# Patient Record
Sex: Female | Born: 1975 | Race: Black or African American | Hispanic: No | Marital: Single | Smoking: Never smoker
Health system: Southern US, Community
[De-identification: ages and names within clinical notes are randomized; demographics above are authoritative.]

## PROBLEM LIST (undated history)

## (undated) DIAGNOSIS — A630 Anogenital (venereal) warts: Secondary | ICD-10-CM

## (undated) DIAGNOSIS — E669 Obesity, unspecified: Secondary | ICD-10-CM

## (undated) DIAGNOSIS — D259 Leiomyoma of uterus, unspecified: Secondary | ICD-10-CM

## (undated) DIAGNOSIS — Z9889 Other specified postprocedural states: Secondary | ICD-10-CM

## (undated) DIAGNOSIS — Z9289 Personal history of other medical treatment: Secondary | ICD-10-CM

## (undated) DIAGNOSIS — D649 Anemia, unspecified: Secondary | ICD-10-CM

## (undated) DIAGNOSIS — A749 Chlamydial infection, unspecified: Secondary | ICD-10-CM

## (undated) DIAGNOSIS — N800 Endometriosis of uterus: Secondary | ICD-10-CM

## (undated) DIAGNOSIS — D219 Benign neoplasm of connective and other soft tissue, unspecified: Secondary | ICD-10-CM

## (undated) HISTORY — PX: DILATION AND CURETTAGE, DIAGNOSTIC / THERAPEUTIC: SUR384

## (undated) HISTORY — DX: Anogenital (venereal) warts: A63.0

## (undated) HISTORY — DX: Benign neoplasm of connective and other soft tissue, unspecified: D21.9

## (undated) HISTORY — DX: Anemia, unspecified: D64.9

## (undated) HISTORY — DX: Morbid (severe) obesity due to excess calories: E66.01

## (undated) HISTORY — PX: BREAST LUMPECTOMY: SHX2

## (undated) HISTORY — DX: Obesity, unspecified: E66.9

## (undated) HISTORY — DX: Leiomyoma of uterus, unspecified: D25.9

## (undated) HISTORY — DX: Endometriosis of uterus: N80.0

---

## 2004-03-22 HISTORY — PX: BREAST EXCISIONAL BIOPSY: SUR124

## 2004-07-10 ENCOUNTER — Emergency Department (HOSPITAL_COMMUNITY): Admission: EM | Admit: 2004-07-10 | Discharge: 2004-07-10 | Payer: Self-pay | Admitting: Emergency Medicine

## 2005-08-29 ENCOUNTER — Other Ambulatory Visit: Admission: RE | Admit: 2005-08-29 | Discharge: 2005-08-29 | Payer: Self-pay | Admitting: Obstetrics and Gynecology

## 2005-09-12 ENCOUNTER — Inpatient Hospital Stay (HOSPITAL_COMMUNITY): Admission: AD | Admit: 2005-09-12 | Discharge: 2005-09-12 | Payer: Self-pay | Admitting: Obstetrics and Gynecology

## 2005-10-23 ENCOUNTER — Inpatient Hospital Stay (HOSPITAL_COMMUNITY): Admission: AD | Admit: 2005-10-23 | Discharge: 2005-10-23 | Payer: Self-pay | Admitting: Obstetrics and Gynecology

## 2006-01-03 ENCOUNTER — Ambulatory Visit (HOSPITAL_COMMUNITY): Admission: RE | Admit: 2006-01-03 | Discharge: 2006-01-03 | Payer: Self-pay | Admitting: Obstetrics and Gynecology

## 2006-01-17 ENCOUNTER — Ambulatory Visit (HOSPITAL_COMMUNITY): Admission: RE | Admit: 2006-01-17 | Discharge: 2006-01-17 | Payer: Self-pay | Admitting: Obstetrics and Gynecology

## 2006-02-22 ENCOUNTER — Inpatient Hospital Stay (HOSPITAL_COMMUNITY): Admission: AD | Admit: 2006-02-22 | Discharge: 2006-02-24 | Payer: Self-pay | Admitting: Obstetrics and Gynecology

## 2006-02-25 ENCOUNTER — Ambulatory Visit: Payer: Self-pay | Admitting: Oncology

## 2006-04-11 ENCOUNTER — Encounter (INDEPENDENT_AMBULATORY_CARE_PROVIDER_SITE_OTHER): Payer: Self-pay | Admitting: Specialist

## 2006-04-11 ENCOUNTER — Inpatient Hospital Stay (HOSPITAL_COMMUNITY): Admission: AD | Admit: 2006-04-11 | Discharge: 2006-04-13 | Payer: Self-pay | Admitting: Obstetrics and Gynecology

## 2006-06-24 ENCOUNTER — Ambulatory Visit: Payer: Self-pay | Admitting: Oncology

## 2006-10-01 ENCOUNTER — Emergency Department (HOSPITAL_COMMUNITY): Admission: EM | Admit: 2006-10-01 | Discharge: 2006-10-01 | Payer: Self-pay | Admitting: Emergency Medicine

## 2007-05-09 ENCOUNTER — Encounter: Admission: RE | Admit: 2007-05-09 | Discharge: 2007-05-09 | Payer: Self-pay | Admitting: Obstetrics and Gynecology

## 2007-08-15 ENCOUNTER — Emergency Department (HOSPITAL_COMMUNITY): Admission: EM | Admit: 2007-08-15 | Discharge: 2007-08-16 | Payer: Self-pay | Admitting: Emergency Medicine

## 2008-04-08 ENCOUNTER — Ambulatory Visit: Payer: Self-pay | Admitting: Internal Medicine

## 2008-04-08 ENCOUNTER — Encounter (INDEPENDENT_AMBULATORY_CARE_PROVIDER_SITE_OTHER): Payer: Self-pay | Admitting: Internal Medicine

## 2008-04-08 DIAGNOSIS — R5383 Other fatigue: Secondary | ICD-10-CM

## 2008-04-08 DIAGNOSIS — J310 Chronic rhinitis: Secondary | ICD-10-CM | POA: Insufficient documentation

## 2008-04-08 DIAGNOSIS — R5381 Other malaise: Secondary | ICD-10-CM

## 2008-04-08 DIAGNOSIS — M79609 Pain in unspecified limb: Secondary | ICD-10-CM

## 2008-04-08 DIAGNOSIS — N644 Mastodynia: Secondary | ICD-10-CM | POA: Insufficient documentation

## 2008-04-09 LAB — CONVERTED CEMR LAB
BUN: 13 mg/dL (ref 6–23)
Creatinine, Ser: 0.76 mg/dL (ref 0.40–1.20)
Eosinophils Relative: 3 % (ref 0–5)
Hemoglobin: 11.3 g/dL — ABNORMAL LOW (ref 12.0–15.0)
Lymphocytes Relative: 38 % (ref 12–46)
MCHC: 31 g/dL (ref 30.0–36.0)
MCV: 81.8 fL (ref 78.0–100.0)
Monocytes Absolute: 0.4 10*3/uL (ref 0.1–1.0)
Monocytes Relative: 7 % (ref 3–12)
Potassium: 4.1 meq/L (ref 3.5–5.3)
Sodium: 139 meq/L (ref 135–145)
TSH: 0.563 microintl units/mL (ref 0.350–4.50)
WBC: 5.3 10*3/uL (ref 4.0–10.5)

## 2008-05-11 ENCOUNTER — Ambulatory Visit: Payer: Self-pay | Admitting: Internal Medicine

## 2008-05-11 DIAGNOSIS — D509 Iron deficiency anemia, unspecified: Secondary | ICD-10-CM

## 2008-06-04 ENCOUNTER — Encounter (INDEPENDENT_AMBULATORY_CARE_PROVIDER_SITE_OTHER): Payer: Self-pay | Admitting: Internal Medicine

## 2008-06-04 ENCOUNTER — Ambulatory Visit (HOSPITAL_COMMUNITY): Admission: RE | Admit: 2008-06-04 | Discharge: 2008-06-04 | Payer: Self-pay | Admitting: Internal Medicine

## 2008-06-04 ENCOUNTER — Ambulatory Visit: Payer: Self-pay | Admitting: Internal Medicine

## 2008-06-04 DIAGNOSIS — R002 Palpitations: Secondary | ICD-10-CM | POA: Insufficient documentation

## 2008-06-04 DIAGNOSIS — D259 Leiomyoma of uterus, unspecified: Secondary | ICD-10-CM

## 2008-06-04 HISTORY — DX: Leiomyoma of uterus, unspecified: D25.9

## 2008-06-08 ENCOUNTER — Ambulatory Visit (HOSPITAL_COMMUNITY): Admission: RE | Admit: 2008-06-08 | Discharge: 2008-06-08 | Payer: Self-pay | Admitting: Internal Medicine

## 2008-06-17 ENCOUNTER — Ambulatory Visit (HOSPITAL_COMMUNITY): Admission: RE | Admit: 2008-06-17 | Discharge: 2008-06-17 | Payer: Self-pay | Admitting: Internal Medicine

## 2008-06-17 ENCOUNTER — Encounter: Payer: Self-pay | Admitting: Internal Medicine

## 2008-08-25 ENCOUNTER — Encounter (INDEPENDENT_AMBULATORY_CARE_PROVIDER_SITE_OTHER): Payer: Self-pay | Admitting: Internal Medicine

## 2008-08-25 ENCOUNTER — Ambulatory Visit: Payer: Self-pay | Admitting: Internal Medicine

## 2008-08-25 DIAGNOSIS — N898 Other specified noninflammatory disorders of vagina: Secondary | ICD-10-CM | POA: Insufficient documentation

## 2008-08-25 DIAGNOSIS — K069 Disorder of gingiva and edentulous alveolar ridge, unspecified: Secondary | ICD-10-CM

## 2008-08-25 DIAGNOSIS — K056 Periodontal disease, unspecified: Secondary | ICD-10-CM | POA: Insufficient documentation

## 2008-08-25 LAB — CONVERTED CEMR LAB
Chlamydia, DNA Probe: NEGATIVE
GC Probe Amp, Genital: NEGATIVE

## 2008-08-26 ENCOUNTER — Encounter (INDEPENDENT_AMBULATORY_CARE_PROVIDER_SITE_OTHER): Payer: Self-pay | Admitting: Internal Medicine

## 2008-10-12 ENCOUNTER — Ambulatory Visit: Payer: Self-pay | Admitting: Internal Medicine

## 2008-10-12 DIAGNOSIS — R030 Elevated blood-pressure reading, without diagnosis of hypertension: Secondary | ICD-10-CM | POA: Insufficient documentation

## 2008-10-12 DIAGNOSIS — F5089 Other specified eating disorder: Secondary | ICD-10-CM | POA: Insufficient documentation

## 2008-10-12 DIAGNOSIS — R51 Headache: Secondary | ICD-10-CM

## 2008-10-12 DIAGNOSIS — R519 Headache, unspecified: Secondary | ICD-10-CM | POA: Insufficient documentation

## 2008-10-13 LAB — CONVERTED CEMR LAB
Basophils Absolute: 0 10*3/uL (ref 0.0–0.1)
Basophils Relative: 0 % (ref 0–1)
Eosinophils Absolute: 0.2 10*3/uL (ref 0.0–0.7)
Eosinophils Relative: 2 % (ref 0–5)
HCT: 33.9 % — ABNORMAL LOW (ref 36.0–46.0)
Hemoglobin: 10.4 g/dL — ABNORMAL LOW (ref 12.0–15.0)
Lymphocytes Relative: 26 % (ref 12–46)
Monocytes Absolute: 0.7 10*3/uL (ref 0.1–1.0)
Monocytes Relative: 8 % (ref 3–12)
Neutrophils Relative %: 63 % (ref 43–77)
Potassium: 3.7 meq/L (ref 3.5–5.3)
RBC: 4.29 M/uL (ref 3.87–5.11)
Sodium: 138 meq/L (ref 135–145)
WBC: 8.5 10*3/uL (ref 4.0–10.5)

## 2008-11-29 ENCOUNTER — Encounter (INDEPENDENT_AMBULATORY_CARE_PROVIDER_SITE_OTHER): Payer: Self-pay | Admitting: Internal Medicine

## 2009-01-17 ENCOUNTER — Ambulatory Visit: Payer: Self-pay | Admitting: Internal Medicine

## 2009-01-18 LAB — CONVERTED CEMR LAB
Eosinophils Absolute: 0.2 10*3/uL (ref 0.0–0.7)
Eosinophils Relative: 4 % (ref 0–5)
HCT: 30.9 % — ABNORMAL LOW (ref 36.0–46.0)
Hemoglobin: 9.8 g/dL — ABNORMAL LOW (ref 12.0–15.0)
MCV: 77.8 fL — ABNORMAL LOW (ref >=78.0)
Neutrophils Relative %: 50 % (ref 43–77)
Platelets: 265 10*3/uL (ref 150–400)
RBC: 3.97 M/uL (ref 3.87–5.11)
RDW: 15.2 % (ref 11.5–15.5)

## 2009-02-16 ENCOUNTER — Ambulatory Visit: Payer: Self-pay | Admitting: Internal Medicine

## 2009-04-22 ENCOUNTER — Encounter (INDEPENDENT_AMBULATORY_CARE_PROVIDER_SITE_OTHER): Payer: Self-pay | Admitting: Internal Medicine

## 2009-04-22 ENCOUNTER — Ambulatory Visit: Payer: Self-pay | Admitting: Internal Medicine

## 2009-04-23 DIAGNOSIS — R05 Cough: Secondary | ICD-10-CM

## 2009-04-27 ENCOUNTER — Ambulatory Visit: Payer: Self-pay | Admitting: Infectious Diseases

## 2009-04-28 LAB — CONVERTED CEMR LAB
Candida species: NEGATIVE
Chlamydia, DNA Probe: NEGATIVE
GC Probe Amp, Genital: NEGATIVE

## 2009-06-17 ENCOUNTER — Telehealth (INDEPENDENT_AMBULATORY_CARE_PROVIDER_SITE_OTHER): Payer: Self-pay | Admitting: *Deleted

## 2009-06-17 ENCOUNTER — Telehealth: Payer: Self-pay | Admitting: *Deleted

## 2009-06-24 ENCOUNTER — Ambulatory Visit: Payer: Self-pay | Admitting: Internal Medicine

## 2009-06-24 DIAGNOSIS — B079 Viral wart, unspecified: Secondary | ICD-10-CM | POA: Insufficient documentation

## 2009-06-24 LAB — CONVERTED CEMR LAB: Gardnerella vaginalis: POSITIVE — AB

## 2009-06-27 LAB — CONVERTED CEMR LAB: Chlamydia, DNA Probe: NEGATIVE

## 2009-12-20 ENCOUNTER — Emergency Department (HOSPITAL_COMMUNITY): Admission: EM | Admit: 2009-12-20 | Discharge: 2009-12-20 | Payer: Self-pay | Admitting: Emergency Medicine

## 2009-12-25 ENCOUNTER — Emergency Department (HOSPITAL_COMMUNITY)
Admission: EM | Admit: 2009-12-25 | Discharge: 2009-12-25 | Payer: Self-pay | Source: Home / Self Care | Admitting: Emergency Medicine

## 2009-12-26 ENCOUNTER — Telehealth (INDEPENDENT_AMBULATORY_CARE_PROVIDER_SITE_OTHER): Payer: Self-pay | Admitting: *Deleted

## 2009-12-29 ENCOUNTER — Telehealth: Payer: Self-pay | Admitting: Internal Medicine

## 2010-03-12 ENCOUNTER — Encounter: Payer: Self-pay | Admitting: Obstetrics and Gynecology

## 2010-03-12 ENCOUNTER — Encounter: Payer: Self-pay | Admitting: Internal Medicine

## 2010-03-14 ENCOUNTER — Ambulatory Visit: Admit: 2010-03-14 | Payer: Self-pay

## 2010-03-19 LAB — CONVERTED CEMR LAB
Anti Nuclear Antibody(ANA): POSITIVE — AB
HCT: 32.8 % — ABNORMAL LOW (ref 36.0–46.0)
Hemoglobin: 10.6 g/dL — ABNORMAL LOW (ref 12.0–15.0)
MCHC: 32.3 g/dL (ref 30.0–36.0)
MCV: 81.2 fL (ref 78.0–100.0)
RBC: 4.04 M/uL (ref 3.87–5.11)
WBC: 7 10*3/uL (ref 4.0–10.5)

## 2010-03-21 NOTE — Assessment & Plan Note (Signed)
Summary: acute-thinks she has a yeast infection/cfb   Vital Signs:  Patient profile:   35 year old female Height:      66 inches (167.64 cm) Weight:      239.5 pounds (108.86 kg) BMI:     38.80 Temp:     97.0 degrees F (36.11 degrees C) oral Pulse rate:   76 / minute BP sitting:   116 / 79  (right arm)  Vitals Entered By: Stanton Kidney Ditzler RN (Jun 24, 2009 11:57 AM) Is Patient Diabetic? No Pain Assessment Patient in pain? no      Nutritional Status BMI of > 30 = obese Nutritional Status Detail appetite good  Have you ever been in a relationship where you felt threatened, hurt or afraid?denies   Does patient need assistance? Functional Status Self care Ambulation Normal Comments Vag odor, skin broken out around lips and wants cream for vag warts.   Primary Care Provider:  Zara Council MD   History of Present Illness: 35 year old with Past Medical History: Uterine fibroids Anemia Obesity  She is having vaginal discharge, since 4 days ago and itching. She denies fever, nausea, abdominal pain.   Depression History:      The patient denies a depressed mood most of the day and a diminished interest in her usual daily activities.         Preventive Screening-Counseling & Management  Alcohol-Tobacco     Smoking Status: never  Caffeine-Diet-Exercise     Does Patient Exercise: no  Current Medications (verified): 1)  None  Allergies: 1)  ! * Shellfish  Review of Systems  The patient denies fever, chest pain, syncope, dyspnea on exertion, peripheral edema, prolonged cough, headaches, hemoptysis, abdominal pain, melena, and hematochezia.    Physical Exam  General:  alert, well-developed, and well-nourished.   Head:  normocephalic, atraumatic, and no abnormalities observed.   Lungs:  normal respiratory effort, no intercostal retractions, and no accessory muscle use.   Abdomen:  soft, non-tender, normal bowel sounds, and no distention.   Genitalia:  normal introitus,  no vaginal or cervical lesions, and vaginal discharge.     Impression & Recommendations:  Problem # 1:  VAGINAL DISCHARGE (ICD-623.5) She is complaining of vaginal discharge. Pelvic exam showed whitish discharge. I will prescribe flagyl empirically for bacterial vaginosis. I will follow culture. She think that she has labial wart. I was not impress by physical exam. I will refer to GYN for further evaluation of wart.  Orders: T-Chlamydia & GC Probe, Genital ([id number]) T-Wet Prep by Molecular Probe [phone])  Problem # 2:  SCREENING FOR MALIGNANT NEOPLASM OF THE CERVIX (ICD-V76.2) I did Pap Smear today.  Orders: T-PAP [facility]) [phone])  Complete Medication List: 1)  Metronidazole 500 Mg Tabs (Metronidazole) .... Take 1 tablet by mouth twice a day.  Other Orders: Gynecologic Referral (Gyn)  Patient Instructions: 1)  Please schedule a follow-up appointment in 3 months. Prescriptions: METRONIDAZOLE 500 MG TABS (METRONIDAZOLE) Take 1 tablet by mouth twice a day.  #14 x 0   Entered and Authorized by:   Hartley Barefoot MD   Signed by:   Hartley Barefoot MD on 06/24/2009   Method used:   Print then Give to Patient   RxID:   [id number]   Prevention & Chronic Care Immunizations   Influenza vaccine: Not documented    Tetanus booster: Not documented    Pneumococcal vaccine: Not documented  Other Screening   Pap smear: NEGATIVE FOR INTRAEPITHELIAL LESIONS OR MALIGNANCY.  (  08/25/2008)   Smoking status: never  (06/24/2009)      Resource handout printed.  Process Orders Check Orders Results:     [facility]: ABN not required for this insurance Tests Sent for requisitioning (Jun 24, 2009 3:05 PM):     06/24/2009: [facility] -- T-Chlamydia & GC Probe, Genital [[id number]] (signed)     06/24/2009: [facility] -- T-Wet Prep by Molecular Probe [phone] (signed)

## 2010-03-21 NOTE — Progress Notes (Signed)
Summary: PREVENTIVE COLONOSCOPY  Phone Note Outgoing Call   Call placed by: Shon Hough,  December 26, 2009 2:44 PM Summary of Call: Patient under the age of 71.  No information listed in EMR or ECHART in regards to a colonscopy. Initial call taken by: Shon Hough,  December 26, 2009 2:44 PM

## 2010-03-21 NOTE — Assessment & Plan Note (Signed)
Summary: CHECKUP/SB.   Vital Signs:  Patient profile:   35 year old female Height:      66 inches (167.64 cm) Weight:      242.6 pounds (108.09 kg) BMI:     39.30 Temp:     97.6 degrees F (36.44 degrees C) oral Pulse rate:   84 / minute BP sitting:   138 / 78  (left arm) Cuff size:   large  Vitals Entered By: Theotis Barrio NT II (April 22, 2009 3:44 PM) CC: RESULTS OF LABS / HAVING SHARP PAIN IN LEFT FOOT / ITCH LEFT BREAST Is Patient Diabetic? No Pain Assessment Patient in pain? no      Nutritional Status 38  Have you ever been in a relationship where you felt threatened, hurt or afraid?No   Does patient need assistance? Functional Status Self care Ambulation Normal Comments LEFT BREAST ITCH / RESULTS OF LABS / COUGH / RESULTS OF LABS   Primary Care Provider:  Zara Council MD  CC:  RESULTS OF LABS / HAVING SHARP PAIN IN LEFT FOOT / ITCH LEFT BREAST.  History of Present Illness: Katherine Hutchinson is a 35 year old Female with PMH/problems as outlined in the EMR, who presents to the [facility] with chief complaint(s) of:    1. waking up every morning with cough and brings up phlegm. this has been going up for about a week now.   2. Left breast: itching: same problem as noted in the past. No obvious swelling or discharge. Off and on itching, has family h/o breast cancer in father side. No axillary LN or any swelling noted. No blood or any abnormal discharge. She couldn't have the breast US as previously arranged. Her orange card expired recently.   3. Continues to have shooting pains down her legs, usually goes to the left side.   Depression History:      The patient denies a depressed mood most of the day and a diminished interest in her usual daily activities.         Preventive Screening-Counseling & Management  Alcohol-Tobacco     Smoking Status: never  Caffeine-Diet-Exercise     Does Patient Exercise: no  Current Medications (verified): 1)  Ferrous Sulfate 324 Mg  Tbec (Ferrous Sulfate) .... Take 1 Tablet By Mouth Three Times A Day 2)  Amoxicillin 500 Mg Tabs (Amoxicillin) .... Take 1 Tablet By Mouth Three Times A Day For Five Days  Allergies (verified): 1)  ! * Shellfish  Past History:  Past Medical History: Last updated: 04/08/2008 Uterine fibroids Anemia Obesity  Past Surgical History: Last updated: 04/08/2008 h/o breast lumpectomy  Family History: Last updated: 04/08/2008 Hypertension in mother, maternal grand ma. Maternal aunt had stroke.   Social History: Last updated: 04/08/2008 High school educated.  Single.   Risk Factors: Exercise: no (04/22/2009)  Risk Factors: Smoking Status: never (04/22/2009)  Social History: Does Patient Exercise:  no  Review of Systems       as per hpi  Physical Exam  General:  alert and overweight-appearing.   Head:  normocephalic and atraumatic.   Eyes:  vision grossly intact, pupils equal, pupils round, and pupils reactive to light.   Ears:  no external deformities.   normal tympanic membranes bilat Nose:  no external deformity.   Mouth:   pharyngeal erythema.   Neck:  supple.   Lungs:  normal respiratory effort and normal breath sounds.   Heart:  normal rate and regular rhythm.   Abdomen:  soft and non-tender.   Msk:  no joint or spine tenderness or inflammation. Pulses:  normal peripheral pulses  Extremities:  no cyanosis, clubbing or edema  Psych:  normally interactive.     Impression & Recommendations:  Problem # 1:  BREAST PAIN, LEFT (ICD-611.71) Patient is still awaiting for ultrasound exam of her left breast problem. This has been going on for a long time: with problems of pain and itching and different times. No definite mass / discharge / lymphadenopathy noted at any time. Nevertheless, will try to expedite the exam and follow.   Problem # 2:  BACK PAIN (ICD-724.5) MRI was cancelled last time, patient felt claustrophobic. I will have the exam rescheduled again, as  patient continues to have shooting pain down her left leg.   Problem # 3:  COUGH (ICD-786.2) some erythema in pharynx. treat with empiric abx for uri.   Complete Medication List: 1)  Ferrous Sulfate 324 Mg Tbec (Ferrous sulfate) .... Take 1 tablet by mouth three times a day 2)  Amoxicillin 500 Mg Tabs (Amoxicillin) .... Take 1 tablet by mouth three times a day for five days  Patient Instructions: 1)  Please schedule a follow-up appointment in 3 months. 2)  Do let us know if your problem worsens.   Prescriptions: AMOXICILLIN 500 MG TABS (AMOXICILLIN) Take 1 tablet by mouth three times a day for five days  #15 x 0   Entered and Authorized by:   Zara Council MD   Signed by:   Zara Council MD on 04/22/2009   Method used:   Electronically to        [facility] [phone]* (retail)       [address]       [place], [place]  [address]       Ph: [phone]       Fax: [phone]   RxID:   [id number]   Prevention & Chronic Care Immunizations   Influenza vaccine: Not documented    Tetanus booster: Not documented    Pneumococcal vaccine: Not documented  Other Screening   Pap smear: NEGATIVE FOR INTRAEPITHELIAL LESIONS OR MALIGNANCY.  (08/25/2008)   Smoking status: never  (04/22/2009)

## 2010-03-21 NOTE — Progress Notes (Signed)
Summary: phone/gg  Phone Note Call from Patient   Summary of Call: Pt had root canal last week and was put on antibiotic.  She is still taking them but now c/o  vaginal yeast infection.  She has itching and always gets yeast infection when she takes antibiotics.  Will you send in Rx for diflucan or something on the $4 list. Initial call taken by: Merrie Roof RN,  December 29, 2009 3:45 PM  Follow-up for Phone Call        Treatment of choice for Candida vaginitis is fluconazole 150 mg by mouth once.  It is on the [facility] $4 formulary and the prescription was sent. Follow-up by: Doneen Poisson MD,  December 29, 2009 5:19 PM    New/Updated Medications: FLUCONAZOLE 150 MG TABS (FLUCONAZOLE) Take 1 tablet by mouth once for a vaginal yeast infection Prescriptions: FLUCONAZOLE 150 MG TABS (FLUCONAZOLE) Take 1 tablet by mouth once for a vaginal yeast infection  #1 x 0   Entered and Authorized by:   Doneen Poisson MD   Signed by:   Doneen Poisson MD on 12/29/2009   Method used:   Electronically to        [facility]  [phone]* (retail)       [address]       [place], [place]  [address]       Ph: [phone] or [phone]       Fax: [phone]   RxID:   [id number]

## 2010-03-21 NOTE — Assessment & Plan Note (Signed)
Summary: FEMALE ISSUE/ SB.   Vital Signs:  Patient profile:   35 year old female Height:      66 inches (167.64 cm) Weight:      242.3 pounds (110.14 kg) BMI:     39.25 Temp:     98.4 degrees F (36.89 degrees C) oral Pulse rate:   74 / minute BP sitting:   130 / 79  (right arm) Cuff size:   large  Vitals Entered By: Theotis Barrio NT II (April 27, 2009 3:01 PM) CC: PATIENT THINKS SHE MIGHT HAVE A YEAST INFECTION   /  Is Patient Diabetic? No Pain Assessment Patient in pain? no      Nutritional Status BMI of > 30 = obese  Have you ever been in a relationship where you felt threatened, hurt or afraid?No   Does patient need assistance? Functional Status Self care Ambulation Normal Comments PATIENT THINKS SHE MIGHT HAVE A YEAST INFECTION   Primary Care Provider:  Zara Council MD  CC:  PATIENT THINKS SHE MIGHT HAVE A YEAST INFECTION   / .  History of Present Illness: Katherine Hutchinson is a 35 year old Female with PMH/problems as outlined in the EMR, who presents to the [facility] with chief complaint(s) of vaginal discharge. She is having itchy discharge from her vagina. she is scared that she could have an STD. No abdominal pain. Having little bit of spotting. She tried monostat last night. She took abx for URI last visit.   Preventive Screening-Counseling & Management  Alcohol-Tobacco     Smoking Status: never  Caffeine-Diet-Exercise     Does Patient Exercise: no  Current Medications (verified): 1)  Ferrous Sulfate 324 Mg Tbec (Ferrous Sulfate) .... Take 1 Tablet By Mouth Three Times A Day 2)  Amoxicillin 500 Mg Tabs (Amoxicillin) .... Take 1 Tablet By Mouth Three Times A Day For Five Days 3)  Fluconazole 150 Mg Tabs (Fluconazole) .... Please Take One Pill.  Allergies (verified): 1)  ! * Shellfish  Past History:  Past Medical History: Last updated: 04/08/2008 Uterine fibroids Anemia Obesity  Past Surgical History: Last updated: 04/08/2008 h/o breast  lumpectomy  Family History: Last updated: 04/08/2008 Hypertension in mother, maternal grand ma. Maternal aunt had stroke.   Social History: Last updated: 04/08/2008 High school educated.  Single.   Risk Factors: Exercise: no (04/27/2009)  Risk Factors: Smoking Status: never (04/27/2009)  Review of Systems       as per HPI  Physical Exam  General:  alert and overweight-appearing.   Neck:  supple.   Lungs:  normal respiratory effort and normal breath sounds.   Heart:  normal rate and regular rhythm.   Abdomen:  soft and non-tender.   Genitalia:  Mild inflammation.  Whitish discharge.  Pulses:  normal peripheral pulses  Extremities:  no cyanosis, clubbing or edema  Neurologic:  non focal.  Psych:  normally interactive.     Impression & Recommendations:  Problem # 1:  VAGINAL DISCHARGE (ICD-623.5) Will treat her empric with fluconazole. Will also get wet prep and probe for GC CT.  Orders: T-Chlamydia & GC Probe, Genital ([id number]) T-Wet Prep by Molecular Probe [phone]  Complete Medication List: 1)  Ferrous Sulfate 324 Mg Tbec (Ferrous sulfate) .... Take 1 tablet by mouth three times a day 2)  Amoxicillin 500 Mg Tabs (Amoxicillin) .... Take 1 tablet by mouth three times a day for five days 3)  Fluconazole 150 Mg Tabs (Fluconazole) .... Please take one pill.  Patient Instructions:  1)  Please schedule a follow-up appointment in 3 months. 2)  We will let you know if anything wrong with your lab work.   3)  Please take the single dose treatment for yeast infection.  Prescriptions: FLUCONAZOLE 150 MG TABS (FLUCONAZOLE) Please take one pill.  #1 x 0   Entered and Authorized by:   Zara Council MD   Signed by:   Zara Council MD on 04/27/2009   Method used:   Print then Give to Patient   RxID:   [id number]  Process Orders Check Orders Results:     [facility]: ABN not required for this insurance Tests Sent for requisitioning  (April 27, 2009 3:29 PM):     04/27/2009: [facility] -- T-Chlamydia & GC Probe, Genital [[id number]] (signed)     04/27/2009: [facility] -- T-Wet Prep by Molecular Probe [phone] (signed)    Prevention & Chronic Care Immunizations   Influenza vaccine: Not documented    Tetanus booster: Not documented    Pneumococcal vaccine: Not documented  Other Screening   Pap smear: NEGATIVE FOR INTRAEPITHELIAL LESIONS OR MALIGNANCY.  (08/25/2008)   Smoking status: never  (04/27/2009)

## 2010-03-21 NOTE — Letter (Signed)
Summary: Out of Work  [facility]  [address]   [place], [place] [address]   Phone: [phone]  Fax: [phone]    April 22, 2009   Employee:  [name]    To Whom It May Concern:   For Medical reasons, please excuse the above named employee from work for the following dates:  Start:   04/21/2009  End:   04/22/2009  If you need additional information, please feel free to contact our office.         Sincerely,    Zara Council MD

## 2010-03-21 NOTE — Progress Notes (Signed)
Summary: phone/gg  Phone Note Call from Patient   Caller: Patient Summary of Call: Pt has just recieved her orange card for [facility] and is asking to be set up for MRI and GYN referral.  She was not able to afford back when this was talked about. Initial call taken by: Merrie Roof RN,  June 17, 2009 3:06 PM  Follow-up for Phone Call        MRI and referral already done 7/7 and 11/29 last year, both pending.  Follow-up by: Zara Council MD,  Jun 21, 2009 8:47 PM  Additional Follow-up for Phone Call Additional follow up Details #1::        Purnell Shoemaker will reschedule appointments Additional Follow-up by: Merrie Roof RN,  Jun 30, 2009 10:04 AM

## 2010-03-21 NOTE — Progress Notes (Signed)
Summary: refill/gg  Phone Note Refill Request  on June 17, 2009 3:04 PM  Refills Requested: Medication #1:  FLUCONAZOLE 150 MG TABS Please take one pill.. Pt c/o yeast infection.  boyfriend just treated   Method Requested: Telephone to Pharmacy Initial call taken by: Merrie Roof RN,  June 17, 2009 3:04 PM  Follow-up for Phone Call        Pt had vaginal D/C in 7/10.  Again 3/10 and tx with fluconazole.  But pt was Rx'd ABX on 3/4 and took until 3/10.  So this may have only been partially treated rather than reinfection via infected boyfried.  Regardless, one more course fluconazole is indicated.  Must be seen if sxs recur.   Follow-up by: Blanch Media MD,  June 17, 2009 4:35 PM  Additional Follow-up for Phone Call Additional follow up Details #1::        Rx called to pharmacy Additional Follow-up by: Merrie Roof RN,  June 17, 2009 5:49 PM    Prescriptions: FLUCONAZOLE 150 MG TABS (FLUCONAZOLE) Please take one pill.  #1 x 0   Entered and Authorized by:   Blanch Media MD   Signed by:   Blanch Media MD on 06/17/2009   Method used:   Telephoned to ...       [facility] (retail)       [address] [place], [place]  [address]       Ph: [phone]       Fax: [phone]   RxID:   [id number]

## 2010-03-23 ENCOUNTER — Encounter: Payer: Self-pay | Admitting: Internal Medicine

## 2010-03-23 ENCOUNTER — Ambulatory Visit (INDEPENDENT_AMBULATORY_CARE_PROVIDER_SITE_OTHER): Payer: Self-pay | Admitting: Internal Medicine

## 2010-03-23 VITALS — BP 124/81 | HR 73 | Temp 97.3°F | Ht 64.0 in | Wt 236.6 lb

## 2010-03-23 DIAGNOSIS — K120 Recurrent oral aphthae: Secondary | ICD-10-CM | POA: Insufficient documentation

## 2010-03-23 MED ORDER — TRIAMCINOLONE ACETONIDE 0.1 % EX OINT: TOPICAL_OINTMENT | Freq: Two times a day (BID) | CUTANEOUS | Status: DC

## 2010-03-23 NOTE — Assessment & Plan Note (Addendum)
The lesion identified on the tip of her tongue resembles an aphthous ulcer; it is less concerning for an infectious lesion (no fever, no adenopathy; although this could easily be a cold sore 2/2 herpes) and appears to be quite benign. Will treat with topical antiinflammatories in the mean time. Advised to follow up with her dentist for general oral care. Instructed to call the clinic if there is worsening of symptoms, at that point may consider evaluating for herpes.

## 2010-03-23 NOTE — Patient Instructions (Addendum)
Apply the lotion to the ulcer 2 to 4 times daily until it is healed. Call the clinic if you have any other questions or concerns.

## 2010-03-23 NOTE — Progress Notes (Signed)
  Subjective:    Patient ID: [name], female    DOB: [birth year], 35 y.o.   MRN: [id number]  HPI  Patient is a 35 y/o with no significant past medical history coming in for a routine visit. She states she has not been seen in the clinic since last May. She has been doing very well otherwise without any interval history of hospitalizations, fever, chills, night sweats, chest pain, sob, palpitations, fatigue, abnormal weight changes, changes in bowel habits, n/v, dysuria, hematuria, vaginal discharge or any other systemic symptoms.  Today, she notes that for the past 4 days, she has noticed a small whitish-grey bump on the tip of her tongue that is painful intermittently and "itchy." She does not know the exact time of onset and cannot identify any particular triggering event. She is not on any steroid medications. She does not have a history of HIV or recurrent infections. She saw a dentist last month.   Review of Systems     Objective:   Physical Exam  Constitutional: She is oriented to person, place, and time. She appears well-developed and well-nourished. No distress.  HENT:  Head: Normocephalic and atraumatic.  Mouth/Throat: Oral lesions present.    Cardiovascular: Normal rate, regular rhythm and normal heart sounds.  Exam reveals no gallop.   No murmur heard. Pulmonary/Chest: Effort normal and breath sounds normal. She has no wheezes. She has no rales.  Abdominal: Soft. Bowel sounds are normal. She exhibits no distension. There is no tenderness.  Neurological: She is alert and oriented to person, place, and time.  Skin: Skin is warm.  Psychiatric: She has a normal mood and affect.          Assessment & Plan:

## 2010-04-24 ENCOUNTER — Encounter: Payer: Self-pay | Admitting: Internal Medicine

## 2010-04-24 ENCOUNTER — Ambulatory Visit (INDEPENDENT_AMBULATORY_CARE_PROVIDER_SITE_OTHER): Payer: Self-pay | Admitting: Internal Medicine

## 2010-04-24 ENCOUNTER — Telehealth: Payer: Self-pay | Admitting: *Deleted

## 2010-04-24 VITALS — BP 134/93 | HR 74 | Temp 97.1°F | Ht 65.0 in

## 2010-04-24 DIAGNOSIS — J069 Acute upper respiratory infection, unspecified: Secondary | ICD-10-CM

## 2010-04-24 NOTE — Patient Instructions (Addendum)
YOU HAVE A FLU-LIKE ILLNESS BUT NOT INFLUENZA  OK TO STAY HOME FOR THE NEXT TWO DAYS (3/5 AND 04/25/10)  PLENTY OF FLUIDS, TAKE TYLENOL (THE GENERIC KIND IS JUST FINE) AND REST  YOU DO NOT NEED ANTIBIOTICS FOR THIS-COUGHING UP PHLEGM IS COMMON IN A CHEST COLD

## 2010-04-24 NOTE — Telephone Encounter (Signed)
Pt called with c/o runny nose, productive cough. Denies fever or other problems. She had work done in house and a lot of dust from fans.  She feels she inhaled these particles. Onset  Wednesday.  We have no appointmetrs today, Pt going to [facility]

## 2010-04-24 NOTE — Progress Notes (Signed)
  Subjective:    Patient ID: Katherine Hutchinson, female    DOB: [birth year], 35 y.o.   MRN: [id number]  HPI Here with URI sx's, associated with mild chest tightness No fevers, pleritic symptoms, etc   Review of Systems Coughing up moderate amount of phlegm    Objective:   Physical Exam  Constitutional: She appears well-developed. No distress.  HENT:  Head: Normocephalic.  Mouth/Throat: Oropharynx is clear and moist. No oropharyngeal exudate.  Eyes: Conjunctivae are normal.  Neck: Normal range of motion. Neck supple. No thyromegaly present.  Cardiovascular: Normal rate and normal heart sounds.   Pulmonary/Chest: Effort normal and breath sounds normal. She has no wheezes. She has no rales. She exhibits no tenderness.  Lymphadenopathy:    She has no cervical adenopathy.  Skin: She is not diaphoretic.          Assessment & Plan:   URI  OTC meds, rest Work note given for 3/5, 3/6

## 2010-05-02 LAB — URINALYSIS, ROUTINE W REFLEX MICROSCOPIC
Bilirubin Urine: NEGATIVE
Hgb urine dipstick: NEGATIVE
Ketones, ur: NEGATIVE mg/dL
Nitrite: NEGATIVE
Specific Gravity, Urine: 1.029 (ref 1.005–1.030)
Urobilinogen, UA: 0.2 mg/dL (ref 0.0–1.0)
pH: 6.5 (ref 5.0–8.0)

## 2010-07-07 NOTE — Discharge Summary (Signed)
[name], [name] NO.:  [id number]   MEDICAL RECORD NO.:  [id number]          PATIENT TYPE:  INP   LOCATION:  9156                          FACILITY:  [facility]   PHYSICIAN:  Osborn Coho, M.D.   DATE OF BIRTH:  Dec 08, 1975   DATE OF ADMISSION:  02/22/2006  DATE OF DISCHARGE:  02/24/2006                               DISCHARGE SUMMARY   ADMITTING DIAGNOSES:  1. Intrauterine pregnancy at [redacted] weeks gestation.  2. Oligohydramnios with abnormal Doppler.   DISCHARGE DIAGNOSES:  1. Intrauterine pregnancy at 31-2/7 weeks.  2. Oligoclonal hydramnios, stable.   [name] is a 35 year old, gravida 5, para 2-0-2-2, who presents as [redacted]  weeks gestation with oligohydramnios and abnormal fetal Doppler studies.  The patient has been followed closely at [facility] and  has also been followed by maternal fetal medicine secondary to thickened  placenta and oligohydramnios.  She has had negative torch titers and  negative genetic screening during this pregnancy. She was admitted for  fluid hydration and repeat ultrasound examination as well as maternal  fetal medicine consult.  This consult was obtained on February 22, 2006.  The patient received IV fluids throughout admission with a stable and  reassuring fetal heart rate. She did not have any regular contractions  or any pain throughout her admission, and her ultrasound examination on  February 24, 2006, found baby in a cephalic presentation, AFI of 7.8,  below the fifth percentile. Cervical measurement 3.7 cm, BPP 8/8 and the  UA fetal Doppler studies 3.33 within normal limits. The patient is,  therefore, discharged to home in stable condition. She is to receive EKG  prior to discharge home for asymptomatic anemia and mild tachycardia  with no shortness of breath or chest pain.  She will follow up with a  TSH at the office of [facility] tomorrow as well as  NST, BPP, and Doppler studies.  The  patient is discharged home today in  stable condition and will call with any problems or concerns. She will  follow up with hematology in 1 week.      Rica Koyanagi, C.N.M.      Osborn Coho, M.D.  Electronically Signed    SDM/MEDQ  D:  02/24/2006  T:  02/24/2006  Job:  [id number]

## 2010-07-07 NOTE — H&P (Signed)
Katherine, Hutchinson NO.:  [id number]   MEDICAL RECORD NO.:  [id number]          PATIENT TYPE:  INP   LOCATION:  9107                          FACILITY:  [facility]   PHYSICIAN:  Janine Limbo, M.D.DATE OF BIRTH:  08-30-1975   DATE OF ADMISSION:  04/11/2006  DATE OF DISCHARGE:                              HISTORY & PHYSICAL   [name] is a 35 year old, gravida 5, para 2-0-2-2, at 46 and 6/7th  weeks, who presented today from the office with cervix 4 cm by Dr.  Cloretta Ned exam.  She was in active labor at the time of her admission.  Pregnancy has been remarkable for:  1)  Elevated BMI; 2)  History of  uterine fibroids, submucosal; 3)  First-trimester bleeding; 4)  History  of postpartum hemorrhage; 5)  History of condylomata; 6)  A thickened  placenta; 7)  Anemia; 8)  Oligohydramnios.   PRENATAL LABORATORY DATA:  Blood type is O-positive, RH antibody  negative, VDRL nonreactive, Rubella titer positive, hepatitis B surface  antigen negative, HIV nonreactive.  Cystic fibrosis testing was  negative.  GC and Chlamydia cultures were negative in July and also  again in the third trimester, Pap was normal in July.  Hemoglobin upon  entry into practice was 9.1; it was 6.9 at 27 weeks.  Her Glucola was  normal.  TSH was evaluated at 31 weeks, which was normal.  Hematology  evaluation was done in January.  EDC of April 26, 2006, was established  by ultrasound in the first trimester.  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  Hemoglobin electrophoresis was  negative.  Repeat RPR was nonreactive.   HISTORY OF PRESENT PREGNANCY:  Patient entered care at approximately 11  weeks.  She had had an ultrasound in the first trimester.  She was  treated for a UTI in the first trimester.  She had a hemorrhoid noted at  14 weeks.  She had had some spotting in early pregnancy that was  evaluated in Maternity Admissions.  She was placed on Protonix at 15  weeks  for reflux.  This was changed to Prilosec secondary to insurance  coverage.  General ultrasound at 17 weeks showed a normal growth and  development.  She had an anatomy ultrasound at 18 weeks showing the  thickened placenta with multiple venous lakes.  She had another  ultrasound at 22 weeks showing again a thickened placenta.  Quadruple  screen was done.  Maternal-Fetal Medicine was consulted secondary to  thickened placenta.  She had normal anatomy, normal growth, normal  Doppler's.  There was borderline low fluid at that time.  Plan was made  to re-scan in two weeks.  Growth sonograms were done every three to four  weeks.  TORCH titers were done, which were essentially negative except  positive HSV-1 and a positive CMV-IgG titer.  Hemoglobin electrophoresis  was normal.  Glucola was normal.  She had another sonogram at Maternal-  Fetal Medicine at 27 weeks showing normal growth, but still borderline  low fluid.  Glucola was normal.  She had another  ultrasound at 29 weeks  showing normal growth.  Placenta was 6.6 cm thick.  Hemoglobin was 8 at  that time.  Her hemoglobin had been 6.9 at 27 weeks and was started on  iron.  At 31 weeks, she had another ultrasound showing an AFI still less  than the 5th percentile.  Doppler's were abnormal.  She was admitted to  Antenatal for betamethasone IV fluids.  Hematology consult was arranged.  The patient did sign tubal papers, but then elected not to pursue this.  She had another ultrasound at 31 weeks showing placenta of 7.61 cm thick  and oligohydramnios noted.  The patient remained on bedrest, NSTs were  begun two times a week, and TSH was checked, which was normal.  At 32  weeks, she had fluid at less than the 3rd percentile, normal BPP, but  still abnormal Doppler's.  She remained on bedrest.  At 33 weeks,  Doppler's were normal, BPP was 8 out of 8, cervix was 4.01 cm long, and  fluid was at the 45th percentile.  Two weeks later, ultrasound  was  showing fluid at the 10th percentile, Doppler's were normal, and normal  BPP.  The rest of her ultrasounds that were done approximately every one  to two weeks showed fluid still slightly low, but BPP's were always 8  out of 8, and growth was always normal.  Doppler's also remained normal.  Group B Strep culture, and GC and Chlamydia cultures were all negative.  The patient presented today for a routine visit and was found to be 4 cm  and was admitted to the hospital.  In 1996, she had a vaginal birth of a  female infant, weight 7 pounds 11 ounces, at 39 weeks.  She was in labor  12 hours.  She had Demerol.  This child was born in [place].  In 2003,  she had a six-week therapeutic termination of her pregnancy without  complications.  In 2004, she had a vaginal birth of a female infant,  weight 7 pounds 12 ounces, at 38 weeks.  She was in labor 7 hours.  She  had Demerol for analgesia.  That child was also born in [place].  She  did have a postpartum hemorrhage and had a transfusion.  In 2005, she  had a therapeutic termination of her pregnancy without complications at  10 weeks.  She did have gestational diabetes during her 2004 pregnancy.   MEDICAL HISTORY:  1. History of using Femcon in the past for two weeks for bleeding.  2. Abnormal Pap once in the past that repeat was normal.  3. Diagnosed with fibroids in 2004.  4. She reports the usual childhood illnesses.  5. Severe anemia after blood loss due to fibroids in 2004.  6. She did receive the previously noted transfusion after delivery in      2005.   PAST SURGICAL HISTORY:  1. Wisdom teeth removed in 2006.  2. Benign breast mass removed in 2006.  3. D&C noted after delivery in 2004.  4. Two TAD's.   ALLERGIES:  SHELLFISH.   FAMILY HISTORY:  Her mother, maternal grandmother, aunts, and first  cousins have hypertension.  Her maternal aunt had a stroke.  GENETIC HISTORY:  Remarkable for the father of the baby's paternal  aunt  having twins.   SOCIAL HISTORY:  The patient is single.  The father of the baby has been  involved during this pregnancy.  He is not presently with her  today.  His name is Engineer, building services.  The patient is high school educated.  She  is currently unemployed.  Her partner also has a high school education.  He is a Geophysical data processor.  She was originally followed by the certified  nurse midwives but was transferred to the physician service secondary to  the issues with oligohydramnios and placental structure.  She denies any  alcohol, drug, or tobacco use during this pregnancy.  She is African-  American with no declared religious preference.   PHYSICAL EXAMINATION:  VITAL SIGNS:  Stable.  The patient is afebrile.  HEENT:  Within normal limits.  LUNGS:  Good breath sounds are clear.  HEART:  Regular rate and rhythm without murmur.  BREASTS:  Soft and nontender.  ABDOMEN:  Fundal height is approximately 35 cm.  Estimated fetal weight  is 5 to 6 pounds.  Uterine contractions every two to three minutes,  moderate quality.  CERVICAL EXAM:  4 cm per Dr. Estanislado Pandy in the office, vertex presentation.  Membranes are currently intact.  Fetal heart rate is reactive with no  decelerations, and a negative spontaneous CST.  EXTREMITIES:  Deep tendon reflexes are 2+ without clonus.  There is a  trace edema noted.   IMPRESSION:  1. Intrauterine pregnancy at 35 and 6/7th weeks.  2. Active labor.  3. Negative Group B Streptococcus.  4. Oligohydramnios.  5. History of thickened placenta on ultrasound.  6. Anemia.   PLAN:  1. Admit to Birthing Suite per consult with Dr. Marline Backbone as      attending physician.  2. Routine physician orders.  3. Patient plans epidural.      Chip Boer L. Emilee Hero, C.N.M.    ______________________________  Janine Limbo, M.D.    VLL/MEDQ  D:  04/11/2006  T:  04/11/2006  Job:  [id number]

## 2010-07-07 NOTE — H&P (Signed)
Katherine Hutchinson, Katherine Hutchinson NO.:  [id number]   MEDICAL RECORD NO.:  [id number]          PATIENT TYPE:  INP   LOCATION:  9156                          [facility]   PHYSICIAN:  Osborn Coho, M.D.   DATE OF BIRTH:  25-Jan-1976   DATE OF ADMISSION:  02/22/2006  DATE OF DISCHARGE:                              HISTORY & PHYSICAL   Ms. Sterne is a 35 year old, gravida 5, para 2-0-2-2 at 31-1/7 weeks' who  presents today for admission secondary to oligohydramnios on ultrasound  today and abnormal Doppler flow studies.  The patient's history has been  remarkable for:  1. Thickened placenta that was noted on ultrasound at 18 weeks.  This      has been followed throughout her pregnancy and has been evaluated      by maternal fetal medicine.  2. Increased BMI.  3. History of uterine fibroid.  4. First trimester bleeding.  5. History of postpartum hemorrhage.  6. History of condylomata.  7. Severe anemia.   PRENATAL LABS:  Blood type is O-positive, Rh antibody negative, VDRL  nonreactive.  Rubella titer positive.  Hepatitis B surface antigen  negative.  HIV is nonreactive.  Cystic fibrosis testing was negative.  GC, Chlamydia cultures were negative in July.  Pap was normal in July.  Hemoglobin upon entry into practice was 9.1.  It was 6.9 on December 6  and 8 on December 20.  The patient also had TORCH titers done during her  pregnancy.  Hemoglobin electrophoresis was normal.  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 HSV 1  and 2 glycoproteins were done with HSV-1 glycoprotein positive, HSV-2  glycoprotein was negative.  Toxoplasmosis titers were done that were  negative.  Quadruple screen was normal.  Glucola was also normal.  EDC  of April 26, 2006, was established by first trimester ultrasound  secondary to questionable LMP.   HISTORY OR PRESENT PREGNANCY:  The patient entered care at 11 weeks.  She had a urine culture that was  positive in the first trimester that  was positive for staph aureus.  It was sensitive to Macrobid.  She was  treated.  Her hemoglobin upon entry into practice was 9.1.  She was seen  at 14 weeks for right lower quadrant pain.  She had also been seen just  prior to that in maternity admissions unit for spotting but no further  spotting had occurred since that time.  She did have a small hemorrhoid  noted.  She was placed on ibuprofen at that time.  She also had some  reflux at 15 weeks that was treated with Protonix.  She had an  ultrasound at 17 weeks showing placenta well away from the cervix.  She  had a formal ultrasound at 18 weeks that showed a grade 1 anterior  placenta with multiple venous lakes and thickened to 5.3 cm.  This was  repeated 4 weeks later with the placenta measuring 7.19 cm thick.  Other  findings were normal.  A consult with maternal fetal medicine was  obtained and  she had an ultrasound at approximately 23 weeks.  Normal  anatomy.  Normal growth at the 54th percentile.  Normal Dopplers were  noted at that time.  There was borderline low fluid with an AFI of 10 at  that time.  She was rescanned approximately 2 weeks after that on  November 29.  She had her first ultrasound at Dr. Frances Nickels office on  November 15, that was the one I was just mentioning.  At that time, they  recommended a growth sonogram every 3-4 weeks, TORCH titers, which were  essentially negative except for positive HSV-1.  Repeat RPR was normal  and hemoglobin electrophoresis was normal.  She had a followup  ultrasound on November 29.  At that time, the placenta was abnormally  thick, measuring 4.8 to 5.35 cm.  There were multiple hypoechoic regions  consistent with venous lakes.  There were no apparent fetal anomalies  and fetal growth was normal.  NSTs were going to be begun at 32 weeks.  Up until today, the growth ultrasounds were within normal limits and  Dopplers and fluid were within normal  limits.  However, on today's  ultrasound, the patient was noted to have oligohydramnios with an AFI of  8 which is less than the 5th percentile and abnormal Dopplers were  noted.  The patient also had an evaluation of iron of her hemoglobin on  December 6, which was 6.9.  Most recently, it was done on December 20  and it was 8.  The patient is therefore to be admitted today for further  observation and IV hydration, betamethasone, and a repeat ultrasound.   OBSTETRICAL HISTORY:  In February 1996, the patient had a vaginal birth  of a female infant, weight 7 pounds 11 ounces at 39 weeks.  She was in  labor 12 hours.  She had Demerol.  She had no complications.  In 2003,  she had a 6-week therapeutic termination of pregnancy without  complications.  In 2004, she had a vaginal delivery of a female infant,  weight 7 pounds 12 ounces at 38 weeks.  She was in labor 7 hours.  She  had Demerol at that time too.  She had a postpartum hemorrhage and was  treated with a transfusion.  Both of her children  were born in [place].  In 2005, she had a 10-week therapeutic termination of pregnancy.  She did have gestational diabetes with her 2004 pregnancy.   MEDICAL HISTORY:  She was on Femcon in the past for abnormal bleeding.  She had 1 abnormal Pap in the past and repeat was normal.  She was  diagnosed with fibroids in 2004.  She has a history of bacterial  vaginosis.  She reports the usual childhood illnesses.  She had a  transfusion following the birth of her second child in 2004.  She had  also had a transfusion prior to that due to severe blood loss due to  fibroids in 2004.   SURGICAL HISTORY:  Includes wisdom teeth in 2006, a breast mass removed  in 2006 that was benign, and 2 TABs.  She also had a D and C following  her 2004 delivery secondary to postpartum hemorrhage.   The patient is SENSITIVE TO SHELLFISH.  FAMILY HISTORY:  Her mother, maternal grandmother, aunts, and first  cousins all  have hypertension.  Paternal aunt had a stroke.   GENETIC HISTORY:  Remarkable for the father of the baby's paternal aunt  having twins.  SOCIAL HISTORY:  The patient is single.  The father of the baby has been  involved.  His name is Buyer, retail.  The patient is high school  educated.  She is currently unemployed.  Her partner is high school  educated.  He is employed as a Naval architect.  She has been followed  originally by the certified nurse midwife service but has, subsequently,  been transferred to the physicians' service secondary to the placental  abnormality.  She denies any alcohol, drug, or tobacco use during this  pregnancy.  She is African-American.   PHYSICAL EXAM:  VITAL SIGNS:  Stable.  The patient is afebrile.  HEENT:  Within normal limits.  LUNGS:  Essentially clear.  HEART:  Regular rate and rhythm without murmur.  BREASTS:  Soft and nontender.  ABDOMEN:  Fundal height is approximately 31 weeks' size.  AFI on  ultrasound today showed oligohydramnios with an AFI of 8, less than the  5th percentile and abnormal Dopplers.  Fetal heart has been in the 150s  at the office.  EXTREMITIES:  Deep tendon reflexes are 2+ without clonus.  There is a  trace edema noted.   IMPRESSION:  1. Intrauterine pregnancy at 31-1/7 weeks'.  2. Oligohydramnios.  3. Abnormal Dopplers.  4. Thickened placenta since 18 weeks.  5. Severe anemia.   PLAN:  1. Admit to antenatal unit for consult with Dr. Osborn Coho as      attending physician.  2. IV hydration will be undertaken for oligohydramnios.  3. Betamethasone series will be begun.  4. Ultrasound will be repeated on Sunday.  5. Bed rest with bathroom privileges.  6. Continuous electronic fetal monitoring.  7. Repeat CBC will be done today.      Renaldo Reel Emilee Hero, C.N.M.      Osborn Coho, M.D.  Electronically Signed    VLL/MEDQ  D:  02/22/2006  T:  02/22/2006  Job:  [id number]

## 2010-08-21 ENCOUNTER — Emergency Department (HOSPITAL_COMMUNITY)
Admission: EM | Admit: 2010-08-21 | Discharge: 2010-08-21 | Disposition: A | Discharge status: Home / Self Care | Payer: Self-pay | Attending: Emergency Medicine | Admitting: Emergency Medicine

## 2010-08-21 ENCOUNTER — Emergency Department (HOSPITAL_COMMUNITY): Payer: Self-pay

## 2010-08-21 DIAGNOSIS — R0602 Shortness of breath: Secondary | ICD-10-CM | POA: Insufficient documentation

## 2010-08-21 DIAGNOSIS — R110 Nausea: Secondary | ICD-10-CM | POA: Insufficient documentation

## 2010-08-21 DIAGNOSIS — R51 Headache: Secondary | ICD-10-CM | POA: Insufficient documentation

## 2010-08-21 DIAGNOSIS — R42 Dizziness and giddiness: Secondary | ICD-10-CM | POA: Insufficient documentation

## 2010-08-21 LAB — DIFFERENTIAL
Eosinophils Relative: 3 % (ref 0–5)
Lymphocytes Relative: 29 % (ref 12–46)
Lymphs Abs: 2 10*3/uL (ref 0.7–4.0)
Monocytes Absolute: 0.6 10*3/uL (ref 0.1–1.0)
Monocytes Relative: 9 % (ref 3–12)

## 2010-08-21 LAB — CBC
HCT: 32.4 % — ABNORMAL LOW (ref 36.0–46.0)
MCH: 25.1 pg — ABNORMAL LOW (ref 26.0–34.0)
MCHC: 32.1 g/dL (ref 30.0–36.0)
MCV: 78.3 fL (ref 78.0–100.0)
RDW: 15.2 % (ref 11.5–15.5)
WBC: 7.1 10*3/uL (ref 4.0–10.5)

## 2010-08-21 LAB — BASIC METABOLIC PANEL
BUN: 12 mg/dL (ref 6–23)
Calcium: 9.1 mg/dL (ref 8.4–10.5)
Chloride: 99 mEq/L (ref 96–112)
Creatinine, Ser: 0.79 mg/dL (ref 0.50–1.10)
GFR calc Af Amer: >60 mL/min (ref >60)

## 2010-08-21 LAB — POCT PREGNANCY, URINE: Preg Test, Ur: NEGATIVE

## 2010-09-26 ENCOUNTER — Ambulatory Visit: Payer: Self-pay | Admitting: Internal Medicine

## 2010-10-13 ENCOUNTER — Ambulatory Visit (INDEPENDENT_AMBULATORY_CARE_PROVIDER_SITE_OTHER): Payer: Self-pay | Admitting: Internal Medicine

## 2010-10-13 ENCOUNTER — Encounter: Payer: Self-pay | Admitting: Internal Medicine

## 2010-10-13 ENCOUNTER — Other Ambulatory Visit: Payer: Self-pay | Admitting: Internal Medicine

## 2010-10-13 VITALS — BP 131/78 | HR 58 | Temp 98.1°F | Ht 65.0 in | Wt 235.2 lb

## 2010-10-13 DIAGNOSIS — N949 Unspecified condition associated with female genital organs and menstrual cycle: Secondary | ICD-10-CM

## 2010-10-13 DIAGNOSIS — D259 Leiomyoma of uterus, unspecified: Secondary | ICD-10-CM

## 2010-10-13 DIAGNOSIS — N644 Mastodynia: Secondary | ICD-10-CM

## 2010-10-13 DIAGNOSIS — N938 Other specified abnormal uterine and vaginal bleeding: Secondary | ICD-10-CM

## 2010-10-13 DIAGNOSIS — D219 Benign neoplasm of connective and other soft tissue, unspecified: Secondary | ICD-10-CM

## 2010-10-13 DIAGNOSIS — N63 Unspecified lump in unspecified breast: Secondary | ICD-10-CM

## 2010-10-13 NOTE — Assessment & Plan Note (Signed)
Will place order for a transvaginal ultrasound for further evaluation. Patient appointment is scheduled at [facility] August 31 at 10:30.

## 2010-10-13 NOTE — Assessment & Plan Note (Signed)
I was not able to palpate a lump on physical examination the patient reports palpating lump on several occasion. We will schedule for ultrasound of the lump. Appointment scheduled for August 30 at 1 PM at [facility].

## 2010-10-13 NOTE — Progress Notes (Signed)
  Subjective:    Patient ID: Katherine Hutchinson, female    DOB: 1975/07/23, 35 y.o.   MRN: [id number]  HPI  Patient is 35 year old female who comes in to clinic for followup and would like to be referred to GYN for further evaluation of her fibroid uterus as well as left breast pain. She tells me she has been diagnosed with fibroid uterus several years ago but has never followed up on it. She experiences regular menstrual period once or 2 times per month. This has been going on for several years. She denies other vaginal discharges, reports being sexually active but is not on oral contraceptive or any other intrauterine device for contraception. She reports using condoms for protection. She denies fevers and chills, no episodes of chest pain or shortness of breath, no abdominal or urinary concerns. Denies also recent sicknesses or hospitalizations  Review of Systems   per HPI Objective:   Physical Exam  Constitutional: Vital signs reviewed.  Patient is a well-developed and well-nourished  in no acute distress and cooperative with exam. Alert and oriented x3.  Cardiovascular: RRR, S1 normal, S2 normal, no MRG, pulses symmetric and intact bilaterally Pulmonary/Chest: CTAB, no wheezes, rales, or rhonchi Abdominal: Soft. Non-tender, non-distended, bowel sounds are normal, no masses, organomegaly, or guarding present.  GU: Refuses vaginal examination at this time  Psychiatric: Normal mood and affect. speech and behavior is normal. Judgment and thought content normal. Cognition and memory are normal.         Assessment & Plan:

## 2010-10-19 ENCOUNTER — Ambulatory Visit
Admission: RE | Admit: 2010-10-19 | Discharge: 2010-10-19 | Disposition: A | Discharge status: Home / Self Care | Payer: Self-pay | Source: Ambulatory Visit | Attending: Family Medicine | Admitting: Family Medicine

## 2010-10-19 DIAGNOSIS — N63 Unspecified lump in unspecified breast: Secondary | ICD-10-CM

## 2010-10-20 ENCOUNTER — Ambulatory Visit (HOSPITAL_COMMUNITY)
Admission: RE | Admit: 2010-10-20 | Discharge: 2010-10-20 | Disposition: A | Discharge status: Home / Self Care | Payer: Self-pay | Source: Ambulatory Visit | Attending: Internal Medicine | Admitting: Internal Medicine

## 2010-10-20 DIAGNOSIS — N938 Other specified abnormal uterine and vaginal bleeding: Secondary | ICD-10-CM | POA: Insufficient documentation

## 2010-10-20 DIAGNOSIS — N949 Unspecified condition associated with female genital organs and menstrual cycle: Secondary | ICD-10-CM | POA: Insufficient documentation

## 2010-10-20 DIAGNOSIS — N831 Corpus luteum cyst of ovary, unspecified side: Secondary | ICD-10-CM | POA: Insufficient documentation

## 2010-10-20 DIAGNOSIS — D259 Leiomyoma of uterus, unspecified: Secondary | ICD-10-CM | POA: Insufficient documentation

## 2010-10-20 DIAGNOSIS — D219 Benign neoplasm of connective and other soft tissue, unspecified: Secondary | ICD-10-CM

## 2010-11-09 ENCOUNTER — Ambulatory Visit (INDEPENDENT_AMBULATORY_CARE_PROVIDER_SITE_OTHER): Payer: Self-pay | Admitting: Internal Medicine

## 2010-11-09 ENCOUNTER — Encounter: Payer: Self-pay | Admitting: Internal Medicine

## 2010-11-09 DIAGNOSIS — Z0000 Encounter for general adult medical examination without abnormal findings: Secondary | ICD-10-CM | POA: Insufficient documentation

## 2010-11-09 DIAGNOSIS — N920 Excessive and frequent menstruation with regular cycle: Secondary | ICD-10-CM

## 2010-11-09 DIAGNOSIS — G8929 Other chronic pain: Secondary | ICD-10-CM | POA: Insufficient documentation

## 2010-11-09 DIAGNOSIS — E669 Obesity, unspecified: Secondary | ICD-10-CM | POA: Insufficient documentation

## 2010-11-09 DIAGNOSIS — D649 Anemia, unspecified: Secondary | ICD-10-CM

## 2010-11-09 DIAGNOSIS — D509 Iron deficiency anemia, unspecified: Secondary | ICD-10-CM

## 2010-11-09 DIAGNOSIS — N644 Mastodynia: Secondary | ICD-10-CM

## 2010-11-09 DIAGNOSIS — M549 Dorsalgia, unspecified: Secondary | ICD-10-CM

## 2010-11-09 MED ORDER — FERROUS SULFATE 325 (65 FE) MG PO TBEC: 325.0000 mg | DELAYED_RELEASE_TABLET | Freq: Two times a day (BID) | ORAL | Status: DC

## 2010-11-09 MED ORDER — IBUPROFEN 600 MG PO TABS: 600.0000 mg | ORAL_TABLET | Freq: Three times a day (TID) | ORAL | Status: DC | PRN

## 2010-11-09 NOTE — Assessment & Plan Note (Signed)
Flu shot given today. Will return for pap smear next week. Fasting lipid panal ordered given history of obesity.

## 2010-11-09 NOTE — Assessment & Plan Note (Signed)
Patient requests diet pills at this visit.  Though she notes healthy diet, she has not actively tried to exercise to max benefit, as she goes to zumba aerobics class once/week.  Plan: try to drink more water, continue healthy diet, and try to exercise to raise your HR at least 30 min/day for at least 3 days per week.

## 2010-11-09 NOTE — Assessment & Plan Note (Addendum)
Patient's symptoms today do not seem to suggest spinal cord/vertebral involvement, but given significant history of herniated disk years ago and history of pain shooting to both legs, we will order a lumbar MRI.  MRI has been ordered in the past, but patient did not have imaging done 2/2 claustrophobia.  Will try to send to center with larger MRI machine.   Today's back pain is likely 2/2 muscle strain/tension given palpable knot and tenseness of left lumbar region.  I asked her to also continue to try warm compresses/warm baths & massage to relieve tension. There is also a possibility of a component of referred pain given her uterine abnormalities that may be contributing to back pain.  Her weight is likely contributing to back pain as well.  Plan: Lumbar MRI Motrin 600mg  q8h PRN pain, may consider switching to tramadol pending MRI results. F/u 1 week Will check CMET given chronic use of NSAIDS Urine pregnancy test since we are ordering MRI

## 2010-11-09 NOTE — Assessment & Plan Note (Signed)
Most likely 2/2 to menorrhagia.    Plan:  Check ferritin & CBC today Start ferrous sulfate 325 BID

## 2010-11-09 NOTE — Patient Instructions (Addendum)
-  Return in 1 week for pap smear. We will also refer you to gynecology since you have heavy menstrual cycles.  -Please go to your MRI appointment so we can further evaluate your back pain.  In the meantime, take 600mg  motrin every 8 hours as needed for back pain.  -Please take 325 mg of iron twice a day because of your anemia.  Try to take medication with orange juice, as that will increase the absorption of iron.  To note, iron may constipate you, so you may need to add a miralax or metamucil to your daily routine; you can get this over the counter, or call the clinic for a prescription.  You will also notice your stools turning black/darker.  -We will follow up regarding your lab results in 1 week when you return.  -Please call the clinic at [phone] should you develop new or worsening symptoms.

## 2010-11-09 NOTE — Assessment & Plan Note (Signed)
Given patient's description of waxing and waning of symptoms and masses feel cystic on palpation & are mobile & tender and no significant findings on [place], pain is likely 2/2 fibrocystic change.  Plan: repeat mammo in 6 months Refer to gyn.

## 2010-11-09 NOTE — Progress Notes (Signed)
Subjective:   Patient ID: [name] female   DOB: [birth year] 35 y.o.   MRN: [id number]  HPI: Ms.Deniya A Farias is a 35 y.o. woman that presents today to go over results from recent mammo & [place] of breast as well as transvaginal US.  Of significance, transvaginal US suggested adenomyosis and mural fibroid.  Of note, she has 3 children (4, 7, 67 yo), all of which were vaginal deliveries.  She is sexually active, and denies pain with intercourse.  She notes significant cramping/pain during menses.  Her cycles are usually regular, with the exception of one cycle being at the end of July (July 25-29) and another cycle occuring earlier in August (Aug 15-19).  Cycles tend to last 5-6 days.    She c/o chronic burning/sore/tight back pain that is 8/10 at worst, 0/10 at best, and 0/10 currently.  She denies associated numbness/tingling.  She uses 600mg  Motrin BID for relief.  She does experience excruciating, out of proportion pain if her children jump on her legs.  She notes that for the last month, she has also noted knots in her back, specifically lower back, which improves with massage.  She has not tried warm/cold compresses, though this has been suggested to her by other physicians.    Today she also requests diet pills. She notes that she has tried to eat healthier but hasn't lost much weight.  She also has tried to exercise more, as she attends zumba once per week.  She is currently unemployed.  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    Past Medical History  Diagnosis Date  . Fibroids     uterine  . Anemia   . Obesity    Current Outpatient Prescriptions  Medication Sig Dispense Refill  . ferrous sulfate 325 (65 FE) MG EC tablet Take 1 tablet (325 mg total) by mouth 2 (two) times daily.  60 tablet  3  . ibuprofen (MOTRIN IB) 600 MG tablet Take 1 tablet (600 mg total) by mouth every 8  (eight) hours as needed for pain.  100 tablet  1   Family History  Problem Relation Age of Onset  . Hypertension Mother   . Stroke Maternal Aunt   . Hypertension Maternal Grandmother    History   Social History  . Marital Status: Single    Spouse Name: N/A    Number of Children: N/A  . Years of Education: N/A   Occupational History  . food service    Social History Main Topics  . Smoking status: Never Smoker   . Smokeless tobacco: None  . Alcohol Use: None  . Drug Use: None  . Sexually Active: None   Social History Narrative   Financial assistance approved for 100% discount at [employer] and has [facility] card per Gavin [place]12/14/2011   Review of Systems: Constitutional: Denies fever, chills, diaphoresis, appetite change. Requests medication to suppress appetite, endorses decreased energy HEENT: Denies photophobia, eye pain, redness, hearing loss, ear pain, congestion, sore throat, rhinorrhea, sneezing, mouth sores, trouble swallowing, neck pain, neck stiffness and tinnitus.   Respiratory: Denies SOB, DOE, cough, chest tightness,  and wheezing.   Cardiovascular: Denies chest pain, palpitations and leg swelling.  Gastrointestinal: Denies nausea, vomiting, abdominal pain, diarrhea, constipation, blood in stool and abdominal distention.  Genitourinary: Denies dysuria, urgency, frequency, hematuria, flank pain and difficulty urinating.  Musculoskeletal: Denies myalgias,  endorses back pain, denies joint swelling, arthralgias and gait problem.  Skin: Denies pallor, rash and wound.  Neurological: Denies dizziness, seizures, syncope, weakness, light-headedness, numbness and headaches.  Psychiatric/Behavioral: Denies suicidal ideation, mood changes, confusion, nervousness, sleep disturbance and agitation  Objective:  Physical Exam: Filed Vitals:   11/09/10 1010  BP: 123/69  Pulse: 68  Temp: 98.6 F (37 C)  TempSrc: Oral  Weight: 236 lb 4.8 oz (107.185 kg)   Constitutional: Vital  signs reviewed.  Patient is a well-developed and well-nourished female in no acute distress and cooperative with exam.  Head: Normocephalic and atraumatic Mouth: no erythema or exudates, dry tongue Eyes: PERRL, EOMI, conjunctivae normal, No scleral icterus.  Neck: Supple, Trachea midline normal ROM Cardiovascular: RRR, S1 normal, S2 normal, no MRG, pulses symmetric and intact bilaterally Chest: palpable mobile, tender, cystic mass under right axilla, similar mass infra-areolar left breast, no hard, nontender, immobile palpable masses Pulmonary/Chest: CTAB, no wheezes, rales, or rhonchi Abdominal: Soft. Non-tender, non-distended, bowel sounds are normal, no masses, organomegaly, or guarding present.  GU: no CVA tenderness Musculoskeletal: No joint deformities, erythema, or stiffness, ROM full, tense left lumbar region below scapula that is tender to palpation Hematology: no axillary adenopathy.  Neurological: A&O x3, Strength is normal and symmetric bilaterally, cranial nerve II-XII are grossly intact, no focal motor deficit, sensory intact to light touch bilaterally.  Skin: Warm, dry and intact. No rash, cyanosis, or clubbing.  Psychiatric: Normal mood and affect. speech and behavior is normal. Judgment and thought content normal. Cognition and memory are normal.   Assessment & Plan:  Case and care discussed with Dr. Meredith Pel.  Please see problem oriented charting for further details. Patient to return in 1 week for pap smear.  Patient to have follow up breast mammo in 6 months. Referral to GYN placed.

## 2010-11-09 NOTE — Assessment & Plan Note (Signed)
Patient has history of submucosal fibroids.  Given transvaginal US results suggesting adenomyosis, patient would appreciate referral to Gynecology to see if there is anything further that can be done, especially given heavy menstrual cycles with clots.  Plan: refer to GYN

## 2010-11-10 NOTE — Progress Notes (Signed)
I discussed patient with resident Dr. Milbert Coulter, and I agree with the plans as outlined in her note.

## 2010-11-16 LAB — POCT I-STAT, CHEM 8
Creatinine, Ser: 1
Glucose, Bld: 88
Hemoglobin: 13.3
Potassium: 3.3 — ABNORMAL LOW

## 2010-11-23 ENCOUNTER — Other Ambulatory Visit (INDEPENDENT_AMBULATORY_CARE_PROVIDER_SITE_OTHER): Payer: Self-pay

## 2010-11-23 DIAGNOSIS — D509 Iron deficiency anemia, unspecified: Secondary | ICD-10-CM

## 2010-11-23 DIAGNOSIS — N938 Other specified abnormal uterine and vaginal bleeding: Secondary | ICD-10-CM

## 2010-11-23 DIAGNOSIS — G8929 Other chronic pain: Secondary | ICD-10-CM

## 2010-11-23 DIAGNOSIS — M549 Dorsalgia, unspecified: Secondary | ICD-10-CM

## 2010-11-23 DIAGNOSIS — Z0000 Encounter for general adult medical examination without abnormal findings: Secondary | ICD-10-CM

## 2010-11-23 LAB — POCT URINE PREGNANCY: Preg Test, Ur: NEGATIVE

## 2010-11-23 LAB — COMPREHENSIVE METABOLIC PANEL
BUN: 10 mg/dL (ref 6–23)
CO2: 26 mEq/L (ref 19–32)
Calcium: 9 mg/dL (ref 8.4–10.5)
Chloride: 105 mEq/L (ref 96–112)
Creat: 0.78 mg/dL (ref 0.50–1.10)

## 2010-11-23 LAB — LIPID PANEL
Cholesterol: 157 mg/dL (ref 0–200)
HDL: 42 mg/dL (ref >39)
Total CHOL/HDL Ratio: 3.7 Ratio
Triglycerides: 64 mg/dL (ref <150)

## 2010-11-23 LAB — CBC
HCT: 34.5 % — ABNORMAL LOW (ref 36.0–46.0)
Hemoglobin: 10.3 g/dL — ABNORMAL LOW (ref 12.0–15.0)
MCH: 24.6 pg — ABNORMAL LOW (ref 26.0–34.0)
MCHC: 29.9 g/dL — ABNORMAL LOW (ref 30.0–36.0)

## 2010-11-28 ENCOUNTER — Inpatient Hospital Stay: Admission: RE | Admit: 2010-11-28 | Payer: Self-pay | Source: Ambulatory Visit

## 2010-12-03 ENCOUNTER — Ambulatory Visit
Admission: RE | Admit: 2010-12-03 | Discharge: 2010-12-03 | Disposition: A | Discharge status: Home / Self Care | Payer: Self-pay | Source: Ambulatory Visit | Attending: Internal Medicine | Admitting: Internal Medicine

## 2010-12-03 DIAGNOSIS — M549 Dorsalgia, unspecified: Secondary | ICD-10-CM

## 2010-12-03 MED ORDER — GADOBENATE DIMEGLUMINE 529 MG/ML IV SOLN
20.0000 mL | Freq: Once | INTRAVENOUS | Status: AC | PRN
  Administered 2010-12-03: 20 mL via INTRAVENOUS

## 2010-12-05 ENCOUNTER — Encounter: Payer: Self-pay | Admitting: Obstetrics and Gynecology

## 2011-01-04 ENCOUNTER — Encounter: Payer: No Typology Code available for payment source | Admitting: Obstetrics and Gynecology

## 2011-01-10 ENCOUNTER — Encounter: Payer: Self-pay | Admitting: Internal Medicine

## 2011-01-17 ENCOUNTER — Ambulatory Visit (INDEPENDENT_AMBULATORY_CARE_PROVIDER_SITE_OTHER): Payer: No Typology Code available for payment source | Admitting: Internal Medicine

## 2011-01-17 ENCOUNTER — Encounter: Payer: Self-pay | Admitting: Internal Medicine

## 2011-01-17 DIAGNOSIS — M549 Dorsalgia, unspecified: Secondary | ICD-10-CM

## 2011-01-17 DIAGNOSIS — N8000 Endometriosis of the uterus, unspecified: Secondary | ICD-10-CM

## 2011-01-17 DIAGNOSIS — N644 Mastodynia: Secondary | ICD-10-CM

## 2011-01-17 DIAGNOSIS — D509 Iron deficiency anemia, unspecified: Secondary | ICD-10-CM

## 2011-01-17 DIAGNOSIS — G8929 Other chronic pain: Secondary | ICD-10-CM

## 2011-01-17 DIAGNOSIS — N8003 Adenomyosis of the uterus: Secondary | ICD-10-CM

## 2011-01-17 HISTORY — DX: Endometriosis of uterus: N80.0

## 2011-01-17 HISTORY — DX: Adenomyosis of the uterus: N80.03

## 2011-01-17 HISTORY — DX: Endometriosis of the uterus, unspecified: N80.00

## 2011-01-17 NOTE — Patient Instructions (Signed)
Please start taking iron supplements.    Please have records from GYN sent to [place].  Please schedule follow up for about 3-6 months from now.  Thanks!

## 2011-01-17 NOTE — Progress Notes (Signed)
Subjective:   Patient ID: Katherine Hutchinson female   DOB: Jun 09, 1975 35 y.o.   MRN: [id number]  HPI: [name] is a 35 y.o. woman who is here today to review results of back MRI.  She notes that the pain in her legs has improved, and back pain is persistent (last described as: She c/o chronic burning/sore/tight back pain that is 8/10 at worst, 0/10 at best, and 0/10 currently. She denies associated numbness/tingling. She uses 600mg  Motrin BID for relief.).  She understands that her weight is likely contributing.  She wants to use a green extract coffee that is supposed to help with weight loss.  She notes that she already eats well.  She continues to take ibuprofen/motrin as needed for back pain.   She has not taken iron supplementation that was prescribed previously.  She did review her menstrual cycle with me, and notes that she is regular, each cycle lasts 5-6d and she uses 6-7 pads on days 1-2, about 5 pads on days 3-5, and about 3 pads on day 6.  She does endorse clots with menstruation.  No metrorrhagia.  She notes that she is interested in starting birth control.    She has no other complaints and notes that she has restarted school at [employer].    Past Medical History  Diagnosis Date  . Fibroids     uterine  . Anemia   . Obesity    Current Outpatient Prescriptions  Medication Sig Dispense Refill  . ibuprofen (MOTRIN IB) 600 MG tablet Take 1 tablet (600 mg total) by mouth every 8 (eight) hours as needed for pain.  100 tablet  1  . ferrous sulfate 325 (65 FE) MG EC tablet Take 1 tablet (325 mg total) by mouth 2 (two) times daily.  60 tablet  3   Family History  Problem Relation Age of Onset  . Hypertension Mother   . Stroke Maternal Aunt   . Hypertension Maternal Grandmother    History   Social History  . Marital Status: Single    Spouse Name: N/A    Number of Children: N/A  . Years of Education: N/A   Occupational History  . food service    Social History Main Topics    . Smoking status: Never Smoker   . Smokeless tobacco: None  . Alcohol Use: None  . Drug Use: None  . Sexually Active: None   Other Topics Concern  . None   Social History Narrative   Financial assistance approved for 100% discount at [facility] and has [facility] card per Gavin [place]12/14/2011   Review of Systems: General: no fevers, chills, changes in weight, changes in appetite Skin: no rash HEENT: no blurry vision, hearing changes, sore throat Pulm: no dyspnea, coughing, wheezing CV: no chest pain, palpitations, shortness of breath Abd: no abdominal pain, nausea/vomiting, diarrhea/constipation GU: no dysuria, hematuria, polyuria Ext: no arthralgias, myalgias Neuro: no weakness, numbness, or tingling   Objective:  Physical Exam: Filed Vitals:   01/17/11 1325  BP: 116/74  Pulse: 71  Temp: 97.6 F (36.4 C)  TempSrc: Oral  Weight: 236 lb (107.049 kg)   General: sitting comfortably in chair, pleasant appearance/affect HEENT: PERRL, EOMI, no scleral icterus, no conjunctival pallor Cardiac: RRR, no rubs, murmurs or gallops Pulm: clear to auscultation bilaterally, moving normal volumes of air Abd: soft, nontender, nondistended, BS normoactive Ext: warm and well perfused, no pedal edema Neuro: alert and oriented X3, cranial nerves II-XII grossly intact, strength and sensation to  light touch equal in bilateral upper and lower extremities  Assessment & Plan:  Case and care discussed with Dr. Aundria Rud.  Pt to return in 4 months after Gyn eval to reassess anemia.

## 2011-01-17 NOTE — Assessment & Plan Note (Addendum)
Asymptomatic. Most likely 2/2 menorrhagia.  Pt to have evaluation by Gynecology, and then may need further eval for CRC by stool cards.

## 2011-01-17 NOTE — Assessment & Plan Note (Signed)
Pt referred to [facility] for further evaluation.  She will postpone pap to her GYN appt.

## 2011-01-17 NOTE — Assessment & Plan Note (Signed)
MRI results reviewed with pt.  No acute pathology noted.  Pain is most likely 2/2 increased central obesity.  Encouraged exercise and healthy diet. No role for opioids at this point. Pt notes pain has improved from prior.

## 2011-01-17 NOTE — Assessment & Plan Note (Signed)
Likely due to [facility], but pt referred to GYN for further evaluation.

## 2011-01-18 NOTE — Progress Notes (Signed)
agree with plans and notes 

## 2011-01-31 ENCOUNTER — Encounter: Payer: No Typology Code available for payment source | Admitting: Advanced Practice Midwife

## 2011-02-28 ENCOUNTER — Telehealth: Payer: Self-pay | Admitting: *Deleted

## 2011-02-28 NOTE — Telephone Encounter (Signed)
Pt calls and states she has changed her gyn appts due to her menses, she is at present but feels itchy and burning, next appt at gyn is feb, wantsto be seen next week, appt given

## 2011-03-01 ENCOUNTER — Encounter: Payer: No Typology Code available for payment source | Admitting: Obstetrics & Gynecology

## 2011-03-06 ENCOUNTER — Ambulatory Visit (INDEPENDENT_AMBULATORY_CARE_PROVIDER_SITE_OTHER): Payer: No Typology Code available for payment source | Admitting: Internal Medicine

## 2011-03-06 ENCOUNTER — Other Ambulatory Visit (HOSPITAL_COMMUNITY)
Admission: RE | Admit: 2011-03-06 | Discharge: 2011-03-06 | Disposition: A | Discharge status: Home / Self Care | Payer: Self-pay | Source: Ambulatory Visit | Attending: Internal Medicine | Admitting: Internal Medicine

## 2011-03-06 ENCOUNTER — Encounter: Payer: Self-pay | Admitting: Internal Medicine

## 2011-03-06 DIAGNOSIS — A630 Anogenital (venereal) warts: Secondary | ICD-10-CM

## 2011-03-06 DIAGNOSIS — Z0000 Encounter for general adult medical examination without abnormal findings: Secondary | ICD-10-CM

## 2011-03-06 DIAGNOSIS — N898 Other specified noninflammatory disorders of vagina: Secondary | ICD-10-CM | POA: Insufficient documentation

## 2011-03-06 DIAGNOSIS — Z01419 Encounter for gynecological examination (general) (routine) without abnormal findings: Secondary | ICD-10-CM | POA: Insufficient documentation

## 2011-03-06 HISTORY — DX: Anogenital (venereal) warts: A63.0

## 2011-03-06 MED ORDER — PODOFILOX 0.5 % EX GEL: Freq: Two times a day (BID) | CUTANEOUS | Status: AC

## 2011-03-06 NOTE — Assessment & Plan Note (Signed)
I will obtain GC/Chlamydia and Wet prep and treat accordingly.

## 2011-03-06 NOTE — Progress Notes (Signed)
  Subjective:   Patient ID: [name] female   DOB: May 25, 1975 36 y.o.   MRN: [id number]  HPI: Ms.Jamee A Schremp is a 36 y.o. female with past medical history as outlined below who presented to the clinic with lesions in her vaginal area. Patient reports that one month ago she had dental work done and was given penicillin. After that she experiencing vaginal discharge and she was prescribed Diflucan by her dentist. On 02/22/2010 she noted that she had again itching and burning sensation in the vaginal area. She examined herself and noted that she had some bumps in the rectal area and vaginal area. She had genital warts at age 63 was given creams and it resolved. She did not  experience it again since then. Patient has been tested in the past for HIV which was negative but would like to be tested again. She has a partner now since 14 years but noted that she does not trust him completly. Denies any rashes anywhere else. Fevers or chills.  She has an appointment in February with Gynecology but would like to do a Pap smear  today  Past Medical History  Diagnosis Date  . Fibroids     uterine  . Anemia   . Obesity    Current Outpatient Prescriptions  Medication Sig Dispense Refill  . ferrous sulfate 325 (65 FE) MG EC tablet Take 1 tablet (325 mg total) by mouth 2 (two) times daily.  60 tablet  3  . ibuprofen (MOTRIN IB) 600 MG tablet Take 1 tablet (600 mg total) by mouth every 8 (eight) hours as needed for pain.  100 tablet  1   Review of Systems: Constitutional: Denies fever, chills, diaphoresis, appetite change and fatigue.  Respiratory: Denies SOB, DOE, cough, chest tightness,  and wheezing.   Cardiovascular: Denies chest pain, palpitations and leg swelling.  Gastrointestinal: Denies nausea, vomiting, abdominal pain, diarrhea, constipation, blood in stool and abdominal distention.  Genitourinary: noted  Dysuria and urgency but denies frequency, hematuria, flank pain and difficulty  urinating.  Skin: Denies pallor, rash and wound.    Objective:  Physical Exam: Filed Vitals:   03/06/11 1034  BP: 119/73  Pulse: 70  Temp: 98.9 F (37.2 C)  TempSrc: Oral  Height: 5\' 5"  (1.651 m)  Weight: 237 lb 14.4 oz (107.911 kg)   Constitutional: Vital signs reviewed.  Patient is a well-developed and well-nourished  in no acute distress and cooperative with exam. Alert and oriented x3.  Neck: Supple,  Cardiovascular: RRR, S1 normal, S2 normal, no MRG, pulses symmetric and intact bilaterally Pulmonary/Chest: CTAB, no wheezes, rales, or rhonchi Abdominal: Soft. Non-tender, non-distended, bowel sounds are normal,  GU: no CVA tenderness Gyn: one small , pin needle size wart on the major labia interiorly  , two small pea  size wart around  the anus at 1 and 11 O'clock. No vaginal discharge noted. Mild friable cervix noted.   Skin: Warm, dry and intact. No rash, cyanosis, or clubbing.

## 2011-03-06 NOTE — Assessment & Plan Note (Addendum)
In 40 % the genital warts resolve without any intervention. I suggest the patient the option of just monitoring  But patient noted that she would like to be treated. First line therapy is podofilox which is indicated if patient is symptomatic including burning sensation, bleeding, obstruction, vaginal discharge etc.  Patient will follow up with Gynecology in February.  I will obtain HIV, RPR today.

## 2011-03-07 ENCOUNTER — Other Ambulatory Visit: Payer: Self-pay | Admitting: Internal Medicine

## 2011-03-07 DIAGNOSIS — N898 Other specified noninflammatory disorders of vagina: Secondary | ICD-10-CM

## 2011-03-07 LAB — HIV ANTIBODY (ROUTINE TESTING W REFLEX): HIV: NONREACTIVE

## 2011-03-07 LAB — URINALYSIS, ROUTINE W REFLEX MICROSCOPIC
Bilirubin Urine: NEGATIVE
Leukocytes, UA: NEGATIVE
Nitrite: NEGATIVE
Protein, ur: NEGATIVE mg/dL
Specific Gravity, Urine: 1.023 (ref 1.005–1.030)
Urobilinogen, UA: 0.2 mg/dL (ref 0.0–1.0)

## 2011-03-07 LAB — GC/CHLAMYDIA PROBE AMP, URINE: GC Probe Amp, Urine: NEGATIVE

## 2011-03-07 LAB — RPR

## 2011-03-07 MED ORDER — METRONIDAZOLE 500 MG PO TABS: 500.0000 mg | ORAL_TABLET | Freq: Three times a day (TID) | ORAL | Status: AC

## 2011-03-07 NOTE — Assessment & Plan Note (Signed)
Bacterial vaginosis noted. Will prescribe Flagyl 500 mg bid for 7 days.

## 2011-03-26 ENCOUNTER — Telehealth: Payer: Self-pay | Admitting: *Deleted

## 2011-03-26 NOTE — Telephone Encounter (Signed)
Appointment given for tomorrow 

## 2011-03-26 NOTE — Telephone Encounter (Signed)
Pt called back with c/o yeast infection.  She has burning and itching.  She denies discharge. Onset last 3 days.  She has this sensation for past 3 months before cycle starts. She has tried OTC suppositories - yeast gard Pharmacy: [facility]

## 2011-03-26 NOTE — Telephone Encounter (Signed)
Pt was seem on 1/16 and was prescribe Flagyl 500 mg bid for 7 days.  Pt now calls with c/o yeast infection and would like something called in for this.  Pt # [id number]

## 2011-03-26 NOTE — Telephone Encounter (Signed)
Lat Wet prep was negative for Candida . She needs to be reevaluated if she continues to have vaginal discharge.

## 2011-03-27 ENCOUNTER — Ambulatory Visit (INDEPENDENT_AMBULATORY_CARE_PROVIDER_SITE_OTHER): Payer: Self-pay | Admitting: Internal Medicine

## 2011-03-27 ENCOUNTER — Encounter: Payer: Self-pay | Admitting: Internal Medicine

## 2011-03-27 VITALS — BP 120/78 | HR 79 | Temp 97.7°F | Wt 235.8 lb

## 2011-03-27 DIAGNOSIS — D509 Iron deficiency anemia, unspecified: Secondary | ICD-10-CM

## 2011-03-27 DIAGNOSIS — A630 Anogenital (venereal) warts: Secondary | ICD-10-CM

## 2011-03-27 DIAGNOSIS — N898 Other specified noninflammatory disorders of vagina: Secondary | ICD-10-CM

## 2011-03-27 DIAGNOSIS — D649 Anemia, unspecified: Secondary | ICD-10-CM

## 2011-03-27 MED ORDER — FLUCONAZOLE 150 MG PO TABS: 150.0000 mg | ORAL_TABLET | Freq: Once | ORAL | Status: DC

## 2011-03-27 MED ORDER — FLUCONAZOLE 150 MG PO TABS: 150.0000 mg | ORAL_TABLET | Freq: Once | ORAL | Status: AC

## 2011-03-27 NOTE — Assessment & Plan Note (Signed)
Resolved with the medical treatment( podofilox).

## 2011-03-27 NOTE — Progress Notes (Addendum)
  Subjective:    Patient ID: [name], female    DOB: 1975-08-25, 36 y.o.   MRN: [id number]  HPI: 36 y/o woman with past medical history significant for iron deficiency anemia likely secondary to uterine fibroids comes to the clinic for vaginal itching and burning for last week.  Patient states that she noticed some brownish discharge about a week ago that lasted for just one day. After that she has been having a lot of itching in her vaginal area but no discharge. She reports having burning sensation when she urinates. But  denies any frequency, urgency, fever , chills nausea or vomiting.    Review of Systems  Constitutional: Negative for fever, chills and fatigue.  HENT: Negative for congestion, rhinorrhea, sneezing and postnasal drip.   Respiratory: Negative for apnea, cough, choking, chest tightness and shortness of breath.   Cardiovascular: Negative for chest pain and leg swelling.  Genitourinary: Positive for dysuria and vaginal discharge. Negative for urgency and frequency.  Neurological: Negative for dizziness, facial asymmetry and light-headedness.       Objective:   Physical Exam  Constitutional: She is oriented to person, place, and time. She appears well-developed and well-nourished. No distress.  HENT:  Head: Normocephalic and atraumatic.  Mouth/Throat: No oropharyngeal exudate.  Eyes: Conjunctivae and EOM are normal. Pupils are equal, round, and reactive to light.  Neck: Normal range of motion. Neck supple. No JVD present. No tracheal deviation present. No thyromegaly present.  Cardiovascular: Normal rate, regular rhythm, normal heart sounds and intact distal pulses.  Exam reveals no gallop and no friction rub.   No murmur heard. Pulmonary/Chest: Effort normal and breath sounds normal. No stridor. No respiratory distress. She has no wheezes. She has no rales. She exhibits no tenderness.  Abdominal: Soft. Bowel sounds are normal. She exhibits no distension. There is no  tenderness. There is no rebound and no guarding.  Genitourinary:       curdy white vaginal discharge noted, no obvious swelling or lumps noted, no warts noted.  Musculoskeletal: Normal range of motion. She exhibits no edema and no tenderness.  Lymphadenopathy:    She has no cervical adenopathy.  Neurological: She is alert and oriented to person, place, and time. She has normal reflexes. She displays normal reflexes. No cranial nerve deficit. Coordination normal.  Skin: Skin is warm. She is not diaphoretic.          Assessment & Plan:

## 2011-03-27 NOTE — Assessment & Plan Note (Signed)
Patient was offered to get Hb levels checked but she refused. - Continue iron  - She has an appointment with OBG- GYN on 04/04/11.

## 2011-03-27 NOTE — Assessment & Plan Note (Signed)
She reports burning and itching in her vaginal area for last few weeks. On exam she had curdy white vaginal discharge likely consistent with yeast infection. - With a wet prep, test her for GC/Chlamydia. - Empirically treat her with fluconazole given the appearance of discharge on pelvic exam.

## 2011-03-27 NOTE — Patient Instructions (Signed)
Please schedule a follow up appointment as needed. Please take your medicines as prescribed.

## 2011-04-04 ENCOUNTER — Ambulatory Visit (INDEPENDENT_AMBULATORY_CARE_PROVIDER_SITE_OTHER): Payer: Self-pay | Admitting: Obstetrics & Gynecology

## 2011-04-04 ENCOUNTER — Encounter: Payer: Self-pay | Admitting: Obstetrics & Gynecology

## 2011-04-04 VITALS — BP 130/84 | HR 77 | Temp 97.7°F | Ht 65.0 in | Wt 233.7 lb

## 2011-04-04 DIAGNOSIS — N926 Irregular menstruation, unspecified: Secondary | ICD-10-CM

## 2011-04-04 NOTE — Patient Instructions (Signed)
Pap Test A Pap test is a sampling of cells from a woman's cervix. The cervix is the opening between the vagina (birth canal) and the uterus (the bottom part of the womb). The cells are scraped from the cervix during a pelvic exam. These cells are then looked at under a microscope to see if the cells are normal or to see if a cancer is developing or there are changes that suggest a cancer will develop. Cervical dysplasia is a condition in which a woman has abnormal changes in the top layer of cells of her cervix. These changes are an early sign that cervical cancer may develop. Pap tests also look for the human papilloma virus (HPV) because it has 4 types that are responsible for 70% of cervical cancer. Infections can also be found during a Pap test such as bacteria, fungus, protozoa and viruses.  Cervical cancer is harder to treat and less likely to have a good outcome if left untreated. Catching the disease at an early stage leads to a better outcome. Since the Pap test was introduced 60 years ago, deaths from cervical cancer have decreased by 70%. Every woman should keep up to date with Pap tests. RISK FACTORS FOR CERVICAL CANCER INCLUDE:   Becoming sexually active before age 18.   Being the daughter of a woman who took diethylstilbestrol (DES) during pregnancy.   Having a sexual partner who has or has had cancer of the penis.   Having a sexual partner whose past partner had cervical cancer or cervical dysplasia (early cell changes which suggest a cancer may develop).   Having a weakened immune system. An example would be HIV or other immunodeficiency disorder.   Having had a sexually transmitted infection such as chlamydia, gonorrhea or HPV.   Having had an abnormal Pap or cancer of the vagina or vulva.   Having had more than one sexual partner.   A history of cervical cancer in a woman's sister or mother.   Not using condoms with new sexual partners.   Smoking.  WHO SHOULD HAVE PAP  TESTS  A Pap test is done to screen for cervical cancer.   The first Pap test should be done at age 21.   Between ages 21 and 29, Pap tests are repeated every 2 years.   Beginning at age 30, you are advised to have a Pap test every 3 years as long as your past 3 Pap tests have been normal.   Some women have medical problems that increase the chance of getting cervical cancer. Talk to your caregiver about these problems. It is especially important to talk to your caregiver if a new problem develops soon after your last Pap test. In these cases, your caregiver may recommend more frequent screening and Pap tests.   The above recommendations are the same for women who have or have not gotten the vaccine for HPV (Human Papillomavirus).   If you had a hysterectomy for a problem that was not a cancer or a condition that could lead to cancer, then you no longer need Pap tests. However, even if you no longer need a Pap test, a regular exam is a good idea to make sure no other problems are starting.    If you are between ages 65 and 70, and you have had normal Pap tests going back 10 years, you no longer need Pap tests. However, even if you no longer need a Pap test, a regular exam is a good idea   to make sure no other problems are starting.    If you have had past treatment for cervical cancer or a condition that could lead to cancer, you need Pap tests and screening for cancer for at least 20 years after your treatment.   If Pap tests have been discontinued, risk factors (such as a new sexual partner) need to be re-assessed to determine if screening should be resumed.   Some women may need screenings more often if they are at high risk for cervical cancer.  PREPARATION FOR A PAP TEST A Pap test should be performed during the weeks before the start of menstruation. Women should not douche or have sexual intercourse for 24 hours before the test. No vaginal creams, diaphragms, or tampons should be  used for 24 hours before the test. To minimize discomfort, a woman should empty her bladder just before the exam. TAKING THE PAP TEST The caregiver will perform a pelvic exam. A metal or plastic instrument (speculum) is placed in the vagina. This is done before your caregiver does a bimanual exam of your internal female organs. This instrument allows your caregiver to see the inside of the vagina and look at the cervix. A small, sterile brush is used to take a sample of cells from the internal opening of the cervix. A small wooden spatula is used to scrape the outside of the cervix. Neither of these two methods to collect cells will cause you pain. These two scrapings are placed on a glass slide or in a small bottle filled with a special liquid. The cells are looked at later under a microscope in a lab. A specialist will look at these cells and determine if the cells are normal. RESULTS OF YOUR PAP TEST  A healthy Pap test shows no abnormal cells or evidence of inflammation.   The presence of abnormally growing cells on the surface of the cervix may be reported as an abnormal Pap test. Different categories of findings are used to describe your Pap test. Your caregiver will go over the importance of these findings with you. The caregiver will then determine what follow-up is needed or when you should have your next pap test.   If you have had two or more abnormal Pap tests:   You may be asked to have a colposcopy. This is a test in which the cervix is viewed with a special lighted microscope.   A cervical tissue sample (biopsy) may also be needed. This involves taking a small tissue sample from the cervix. The sample is looked at under a microscope to find the cause of the abnormal cells. Make sure you find out the results of the Pap test. If you have not received the results within two weeks, contact your caregiver's office for the results. Do not assume everything is normal if you have not heard from  your caregiver or medical facility. It is important to follow up on all of your test results.  Document Released: 04/28/2002 Document Revised: 10/18/2010 Document Reviewed: 06/30/2007 [employer] Patient Information 2012 [employer]. 

## 2011-04-04 NOTE — Progress Notes (Signed)
Subjective:    Patient ID: Katherine Hutchinson, female    DOB: 1975-06-17, 36 y.o.   MRN: [id number]  HPINo obstetric history on file. Patient's last menstrual period was 02/25/2011. Patient is referred by her primary care physician 2 to a recent irregular menstrual period. Generally her cycles are very regular about every 28-30 days. She had one cycle that started at in 15 days. After that they've gone back to normal. She was recently treated for yeast vaginitis and this has resolved. She had a Pap test 03/06/2011 that was normal.  Past Medical History  Diagnosis Date  . Fibroids     uterine  . Anemia   . Obesity    Past Surgical History  Procedure Date  . Breast lumpectomy    Current outpatient prescriptions:ferrous sulfate 325 (65 FE) MG EC tablet, Take 1 tablet (325 mg total) by mouth 2 (two) times daily., Disp: 60 tablet, Rfl: 3 Allergies  Allergen Reactions  . Shellfish-Derived Products    History   Social History  . Marital Status: Single    Spouse Name: N/A    Number of Children: N/A  . Years of Education: N/A   Occupational History  . food service    Social History Main Topics  . Smoking status: Never Smoker   . Smokeless tobacco: Not on file  . Alcohol Use: Not on file  . Drug Use: Not on file  . Sexually Active: Not on file   Other Topics Concern  . Not on file   Social History Narrative   Financial assistance approved for 100% discount at [facility] and has [facility] card per [place]12/14/2011   Family History  Problem Relation Age of Onset  . Hypertension Mother   . Stroke Maternal Aunt   . Hypertension Maternal Grandmother      Review of Systems No vaginal discharge vaginal lesion or vulvar itching    Objective:   Physical Exam Filed Vitals:   04/04/11 1555  BP: 130/84  Pulse: 77  Temp: 97.7 F (36.5 C)  TempSrc: Oral  Height: 5\' 5"  (1.651 m)  Weight: 233 lb 11.2 oz (106.006 kg)   Pleasant affect no acute distress. Pelvic exam was deferred  today  Study Result     *RADIOLOGY REPORT*  Clinical Data: Known fibroids with pelvic pain, cramping and  dysfunctional uterine bleeding. LMP 10/04/2010  TRANSABDOMINAL AND TRANSVAGINAL ULTRASOUND OF PELVIS  Technique: Both transabdominal and transvaginal ultrasound  examinations of the pelvis were performed. Transabdominal technique  was performed for global imaging of the pelvis including uterus,  ovaries, adnexal regions, and pelvic cul-de-sac.  Comparison: 2010  It was necessary to proceed with endovaginal exam following the  transabdominal exam to visualize the myometrium, endometrium and  adnexa.  Findings:  Uterus: Is enlarged with a sagittal length of 10.3 cm, an AP depth  of 7.0 cm and a transverse width of 7.0 cm. The myometrium is  diffusely heterogeneous with shadowing from both the posterior and  the anterior myometrium and a somewhat bulbous appearance. This  appearance is most suggestive of diffuse adenomyosis. One small  focal measurable fibroid is seen, mural, measuring 1.1 x 0.9 x 1.1  cm in the posterior upper uterine segment on the right.  Endometrium: The myometrial endometrial interface is slightly  irregular anteriorly and would correlate with the suspicion of  adenomyosis. The lining has a late tri-layered appearance with an  AP width of 11 mm and this would correlate with a late  periovulatory lining.  No definite focal abnormality is seen  Right ovary: Measures 4.3 x 3.4 x 3.8 cm and contains a collapsing  corpus luteum  Left ovary: Measures 3.7 x 1.9 x 1.4 cm and has a normal appearance  Other findings: No pelvic fluid or separate adnexal masses are  seen.  IMPRESSION:  Sonographic findings suspicious for adenomyosis. If clinical  concern warrants, this can be confirmed with pelvic MRI without  contrast.  No focal endometrial abnormalities seen with an anticipated  appearance for current phase of cycle.  Normal periovulatory ovaries.  Original Report  Authenticated By: Bertha Stakes, M.D.        Assessment & Plan:  Patient was referred to to a recent irregular menstrual period but since that time she has done well and she believes that she does not to have a pelvic exam today. She had normal Pap test. Ultrasound done in August 2012 did not show any evidence of fibroids. She plans to return to our offices for her regular gynecologic care. Dr. Scheryl Darter 04/04/2011

## 2011-04-04 NOTE — Progress Notes (Signed)
Pt refuses to take pregnancy test.

## 2011-04-09 ENCOUNTER — Emergency Department (HOSPITAL_COMMUNITY): Payer: Self-pay

## 2011-04-09 ENCOUNTER — Encounter (HOSPITAL_COMMUNITY): Payer: Self-pay | Admitting: *Deleted

## 2011-04-09 ENCOUNTER — Emergency Department (HOSPITAL_COMMUNITY)
Admission: EM | Admit: 2011-04-09 | Discharge: 2011-04-09 | Disposition: A | Discharge status: Home / Self Care | Payer: Self-pay | Attending: Emergency Medicine | Admitting: Emergency Medicine

## 2011-04-09 DIAGNOSIS — R109 Unspecified abdominal pain: Secondary | ICD-10-CM | POA: Insufficient documentation

## 2011-04-09 DIAGNOSIS — Z3490 Encounter for supervision of normal pregnancy, unspecified, unspecified trimester: Secondary | ICD-10-CM

## 2011-04-09 DIAGNOSIS — N939 Abnormal uterine and vaginal bleeding, unspecified: Secondary | ICD-10-CM

## 2011-04-09 DIAGNOSIS — O26859 Spotting complicating pregnancy, unspecified trimester: Secondary | ICD-10-CM | POA: Insufficient documentation

## 2011-04-09 LAB — DIFFERENTIAL
Eosinophils Absolute: 0.2 10*3/uL (ref 0.0–0.7)
Eosinophils Relative: 4 % (ref 0–5)
Lymphs Abs: 1.8 10*3/uL (ref 0.7–4.0)
Monocytes Absolute: 0.5 10*3/uL (ref 0.1–1.0)
Monocytes Relative: 8 % (ref 3–12)

## 2011-04-09 LAB — CBC
HCT: 32.1 % — ABNORMAL LOW (ref 36.0–46.0)
Hemoglobin: 10.1 g/dL — ABNORMAL LOW (ref 12.0–15.0)
MCH: 24.4 pg — ABNORMAL LOW (ref 26.0–34.0)
MCV: 77.5 fL — ABNORMAL LOW (ref 78.0–100.0)
RBC: 4.14 MIL/uL (ref 3.87–5.11)

## 2011-04-09 LAB — WET PREP, GENITAL
Trich, Wet Prep: NONE SEEN
Yeast Wet Prep HPF POC: NONE SEEN

## 2011-04-09 LAB — URINALYSIS, ROUTINE W REFLEX MICROSCOPIC
Leukocytes, UA: NEGATIVE
Nitrite: NEGATIVE
Specific Gravity, Urine: 1.039 — ABNORMAL HIGH (ref 1.005–1.030)
Urobilinogen, UA: 0.2 mg/dL (ref 0.0–1.0)

## 2011-04-09 LAB — URINE MICROSCOPIC-ADD ON

## 2011-04-09 MED ORDER — PRENATAL RX 60-1 MG PO TABS: 1.0000 | ORAL_TABLET | Freq: Every day | ORAL | Status: DC

## 2011-04-09 NOTE — ED Provider Notes (Signed)
Medical screening examination/treatment/procedure(s) were conducted as a shared visit with non-physician practitioner(s) and myself.  I personally evaluated the patient during the encounter  Cyndra Numbers, MD 04/09/11 2254

## 2011-04-09 NOTE — ED Notes (Signed)
Received pt from pod. Here with c/o vag bleeding. States she's spotting but did see some blood  on her panties this am. Pt denies pain. Pt states she has anemia and her H & H is always low.

## 2011-04-09 NOTE — ED Notes (Signed)
Patient reports light bleeding/spotting since Saturday.  She states she has been cramping as well. Patient has taken motrin for her sx.  Patient states she took a pregnancy test and states she is preg.  Her last period was 01-06

## 2011-04-09 NOTE — Discharge Instructions (Signed)
Your Ultrasound looks normal with living intrauterine pregnancy estimated at 6wks. Start prenatal vitamins. Tylenol for pain. Randel Pigg an eye on bleeding. If worsening return to [facility]. Otherwise follow up with your OB/GYN  Vaginal Bleeding During Pregnancy A small amount of bleeding from the vagina can happen anytime during pregnancy. Be sure to tell your doctor about all vaginal bleeding.  HOME CARE  Get plenty of rest and sleep.   Count the number of pads you use each day. Do not use tampons.   Save any tissue you pass for your doctor to see.   Do not exercise   Do not do any heavy lifting.   Avoid going up and down stairs. If you must climb stairs, go slowly.   Do not have sex (intercourse) or orgasms until approved by your doctor.   Do not douche.   Only take medicine as told by your doctor. Do not take aspirin.   Eat healthy.   Always keep your follow-up appointments.  GET HELP RIGHT AWAY IF:   You feel the baby moving less or not moving at all.   The bleeding gets worse.   You have very painful cramps or pain in your stomach or back.   You pass large clots or anything that looks like tissue.   You have a temperature by mouth above 102 F (38.9 C).   You feel very weak.   You have chills.   You feel dizzy or pass out (faint).   You have a gush of fluid from the vagina.  MAKE SURE YOU:   Understand these instructions.   Will watch your condition.   Will get help right away if you are not doing well or get worse.  Document Released: 11/15/2007 Document Revised: 10/18/2010 Document Reviewed: 01/11/2009 [facility] Patient Information 2012 [place], [place].

## 2011-04-09 NOTE — ED Provider Notes (Signed)
History     CSN: [id number]  Arrival date & time 04/09/11  4098   First MD Initiated Contact with Patient 04/09/11 [phone]      Chief Complaint  Patient presents with  . Vaginal Bleeding    (Consider location/radiation/quality/duration/timing/severity/associated sxs/prior treatment) HPI 36 yo [id number] with LMP 02/24/2010 presents complaining of 2 days of cramping, suprapubic pain, and intermittent spotting.  She complains of mild constipation but denies any urinary symptoms or other vaginal discharge.  She denies any history of STI's or fevers.  Patient has been taking motrin for the past few days for this.  Patient is not currently on prenatal vitamins and has not yest established OB-GYN care for this pregnancy. Past Medical History  Diagnosis Date  . Fibroids     uterine  . Anemia   . Obesity     Past Surgical History  Procedure Date  . Breast lumpectomy     Family History  Problem Relation Age of Onset  . Hypertension Mother   . Stroke Maternal Aunt   . Hypertension Maternal Grandmother     History  Substance Use Topics  . Smoking status: Never Smoker   . Smokeless tobacco: Not on file  . Alcohol Use: Yes    OB History    Grav Para Term Preterm Abortions TAB SAB Ect Mult Living                  Review of Systems  Constitutional: Negative.   HENT: Negative.   Eyes: Negative.   Respiratory: Negative.   Cardiovascular: Negative.   Gastrointestinal: Positive for abdominal pain.  Genitourinary: Positive for vaginal bleeding.  Musculoskeletal: Negative.   Neurological: Negative.   Hematological: Negative.   Psychiatric/Behavioral: Negative.   All other systems reviewed and are negative.    Allergies  Shellfish-derived products  Home Medications   Current Outpatient Rx  Name Route Sig Dispense Refill  . IBUPROFEN 200 MG PO TABS Oral Take 800 mg by mouth every 6 (six) hours as needed. For pain      BP 121/77  Pulse 85  Temp(Src) 98 F (36.7 C)  (Oral)  Resp 18  Ht 5\' 5"  (1.651 m)  Wt 233 lb (105.688 kg)  BMI 38.77 kg/m2  SpO2 100%  Physical Exam  Nursing note and vitals reviewed. GEN: Well-developed, well-nourished female in no distress HEENT: Atraumatic, normocephalic. Oropharynx clear without erythema EYES: PERRLA BL, no scleral icterus. NECK: Trachea midline, no meningismus CV: regular rate and rhythm. No murmurs, rubs, or gallops PULM: No respiratory distress.  No crackles, wheezes, or rales. GI: soft, non-tender. No guarding, rebound. + bowel sounds  Patient with mild TTP in the suprapubic region GU: speculum exam with light brown discharge that is minimal, cervix is closed.  Bimanual exam with no CMT or adnexal tenderness Neuro: cranial nerves 2-12 intact, no abnormalities of strength or sensation, A and O x 3 MSK: Patient moves all 4 extremities symmetrically, no deformity, edema, or injury noted Psych: no abnormality of mood   ED Course  Procedures (including critical care time)  Labs Reviewed  URINALYSIS, ROUTINE W REFLEX MICROSCOPIC - Abnormal; Notable for the following:    APPearance CLOUDY (*)    Specific Gravity, Urine 1.039 (*)    Hgb urine dipstick MODERATE (*)    Ketones, ur 15 (*)    All other components within normal limits  WET PREP, GENITAL - Abnormal; Notable for the following:    Clue Cells Wet Prep HPF POC MODERATE (*)  WBC, Wet Prep HPF POC FEW (*)    All other components within normal limits  POCT PREGNANCY, URINE - Abnormal; Notable for the following:    Preg Test, Ur POSITIVE (*)    All other components within normal limits  CBC - Abnormal; Notable for the following:    Hemoglobin 10.1 (*)    HCT 32.1 (*)    MCV 77.5 (*)    MCH 24.4 (*)    All other components within normal limits  HCG, SERUM, QUALITATIVE - Abnormal; Notable for the following:    Preg, Serum POSITIVE (*)    All other components within normal limits  URINE MICROSCOPIC-ADD ON - Abnormal; Notable for the following:      Squamous Epithelial / LPF MANY (*)    Bacteria, UA FEW (*)    All other components within normal limits  HCG, QUANTITATIVE, PREGNANCY - Abnormal; Notable for the following:    hCG, Beta Chain, Quant, S 14051 (*)    All other components within normal limits  DIFFERENTIAL  TYPE AND SCREEN  ABO/RH  GC/CHLAMYDIA PROBE AMP, GENITAL   No results found.   1. Intrauterine normal pregnancy   2. Vaginal spotting       MDM  Patient was evaluated by myself and found to have mild vaginal spotting.  She was hemodynamically stable and in no acute distress.  Patient had minimal symptoms.  Urine was unremarkable and patient had quant of 14,000 with positive pregnancy test.  Wet prep was positive for clue cells.  Patient was transferred to CDU where care was assumed by PA for follow-up of TVUS.  Upon review of the patient's chart from yesterday it is evident that she had clue cells. I spoke with the patient today about that. Patient is continuing to have some vaginal spotting. She is planning on following up with an OB/GYN. Given that she did have clue cells and continued to have spotting I did call in a prescription for her for Flagyl 500 mg by mouth twice a day x7 days. Patient was aware that this was not a sexual transmitted disease and that she is to followup with her OB/GYN if her symptoms do not resolve.        Cyndra Numbers, MD 04/10/11 [phone]

## 2011-04-09 NOTE — ED Provider Notes (Signed)
Pt seen and examined by me. Pt with lower abdominal cramping, spotting. She states she took pregnancy test athome and it was positive. Pt had an US done here, placed inCDU awaiting results.   Results for orders placed during the hospital encounter of 04/09/11  URINALYSIS, ROUTINE W REFLEX MICROSCOPIC      Component Value Range   Color, Urine YELLOW  YELLOW    APPearance CLOUDY (*) CLEAR    Specific Gravity, Urine 1.039 (*) 1.005 - 1.030    pH 6.0  5.0 - 8.0    Glucose, UA NEGATIVE  NEGATIVE (mg/dL)   Hgb urine dipstick MODERATE (*) NEGATIVE    Bilirubin Urine NEGATIVE  NEGATIVE    Ketones, ur 15 (*) NEGATIVE (mg/dL)   Protein, ur NEGATIVE  NEGATIVE (mg/dL)   Urobilinogen, UA 0.2  0.0 - 1.0 (mg/dL)   Nitrite NEGATIVE  NEGATIVE    Leukocytes, UA NEGATIVE  NEGATIVE   WET PREP, GENITAL      Component Value Range   Yeast Wet Prep HPF POC NONE SEEN  NONE SEEN    Trich, Wet Prep NONE SEEN  NONE SEEN    Clue Cells Wet Prep HPF POC MODERATE (*) NONE SEEN    WBC, Wet Prep HPF POC FEW (*) NONE SEEN   POCT PREGNANCY, URINE      Component Value Range   Preg Test, Ur POSITIVE (*) NEGATIVE   CBC      Component Value Range   WBC 5.8  4.0 - 10.5 (K/uL)   RBC 4.14  3.87 - 5.11 (MIL/uL)   Hemoglobin 10.1 (*) 12.0 - 15.0 (g/dL)   HCT 16.1 (*) 09.6 - 46.0 (%)   MCV 77.5 (*) 78.0 - 100.0 (fL)   MCH 24.4 (*) 26.0 - 34.0 (pg)   MCHC 31.5  30.0 - 36.0 (g/dL)   RDW 04.5  40.9 - 81.1 (%)   Platelets 237  150 - 400 (K/uL)  DIFFERENTIAL      Component Value Range   Neutrophils Relative 58  43 - 77 (%)   Neutro Abs 3.3  1.7 - 7.7 (K/uL)   Lymphocytes Relative 30  12 - 46 (%)   Lymphs Abs 1.8  0.7 - 4.0 (K/uL)   Monocytes Relative 8  3 - 12 (%)   Monocytes Absolute 0.5  0.1 - 1.0 (K/uL)   Eosinophils Relative 4  0 - 5 (%)   Eosinophils Absolute 0.2  0.0 - 0.7 (K/uL)   Basophils Relative 0  0 - 1 (%)   Basophils Absolute 0.0  0.0 - 0.1 (K/uL)  HCG, SERUM, QUALITATIVE      Component Value Range   Preg, Serum POSITIVE (*) NEGATIVE   TYPE AND SCREEN      Component Value Range   ABO/RH(D) O POS     Antibody Screen NEG     Sample Expiration 04/12/2011    URINE MICROSCOPIC-ADD ON      Component Value Range   Squamous Epithelial / LPF MANY (*) RARE    WBC, UA 0-2  <3 (WBC/hpf)   RBC / HPF 0-2  <3 (RBC/hpf)   Bacteria, UA FEW (*) RARE    Urine-Other MUCOUS PRESENT    ABO/RH      Component Value Range   ABO/RH(D) O POS    HCG, QUANTITATIVE, PREGNANCY      Component Value Range   hCG, Beta Chain, Quant, S 14051 (*) <5 (mIU/mL)   US Ob Comp Less 14 Wks  04/09/2011  *  RADIOLOGY REPORT*  Clinical Data: Bleeding.  OBSTETRIC <14 WK [place] AND TRANSVAGINAL OB US  Technique:  Both transabdominal and transvaginal ultrasound examinations were performed for complete evaluation of the gestation as well as the maternal uterus, adnexal regions, and pelvic cul-de-sac.  Transvaginal technique was performed to assess early pregnancy.  Comparison:  None.  Intrauterine gestational sac:  Visualized/normal in shape. Yolk sac: Visualized. Embryo: Visualized. Cardiac Activity: Visualized. Heart Rate: 141 bpm  CRL: 3.7   mm  6   w  0   d         [place] EDC: 12/03/2011  Maternal uterus/adnexae: No subchorionic hemorrhage or free fluid.  Uterine echotexture appears somewhat heterogeneous.  Ovaries are visualized.  There may be a resolving corpus luteum cyst in the right ovary.  IMPRESSION:  1.  Single living intrauterine pregnancy with gestational age of [redacted] weeks 0 days and estimated date of confinement of 12/03/2011.  No complicating features. 2.  Uterine heterogeneity can be seen in the setting of adenomyosis.  Original Report Authenticated By: Reyes Ivan, M.D.   US Ob Transvaginal  04/09/2011  *RADIOLOGY REPORT*  Clinical Data: Bleeding.  OBSTETRIC <14 WK [place] AND TRANSVAGINAL OB US  Technique:  Both transabdominal and transvaginal ultrasound examinations were performed for complete evaluation of the gestation as well  as the maternal uterus, adnexal regions, and pelvic cul-de-sac.  Transvaginal technique was performed to assess early pregnancy.  Comparison:  None.  Intrauterine gestational sac:  Visualized/normal in shape. Yolk sac: Visualized. Embryo: Visualized. Cardiac Activity: Visualized. Heart Rate: 141 bpm  CRL: 3.7   mm  6   w  0   d         [place] EDC: 12/03/2011  Maternal uterus/adnexae: No subchorionic hemorrhage or free fluid.  Uterine echotexture appears somewhat heterogeneous.  Ovaries are visualized.  There may be a resolving corpus luteum cyst in the right ovary.  IMPRESSION:  1.  Single living intrauterine pregnancy with gestational age of [redacted] weeks 0 days and estimated date of confinement of 12/03/2011.  No complicating features. 2.  Uterine heterogeneity can be seen in the setting of adenomyosis.  Original Report Authenticated By: Reyes Ivan, M.D.   Pt's US showed single live intrauterine pregnancy at [redacted] wks gestation. Pt informed about results. Pt is in NAD now. VS normal. Will d/ch ome. Start on prenatal vitamins. Pt states she has an OB/GYN doctor that she will call to make an appointment    Lottie Mussel, PA 04/09/11 1555

## 2011-04-10 LAB — GC/CHLAMYDIA PROBE AMP, GENITAL: GC Probe Amp, Genital: NEGATIVE

## 2011-04-10 MED ORDER — METRONIDAZOLE 500 MG PO TABS: 500.0000 mg | ORAL_TABLET | Freq: Two times a day (BID) | ORAL | Status: AC

## 2011-04-13 ENCOUNTER — Encounter (HOSPITAL_COMMUNITY): Payer: Self-pay

## 2011-04-13 ENCOUNTER — Inpatient Hospital Stay (HOSPITAL_COMMUNITY)
Admission: AD | Admit: 2011-04-13 | Discharge: 2011-04-13 | Disposition: A | Discharge status: Home Health | Payer: Self-pay | Source: Ambulatory Visit | Attending: Obstetrics & Gynecology | Admitting: Obstetrics & Gynecology

## 2011-04-13 DIAGNOSIS — O209 Hemorrhage in early pregnancy, unspecified: Secondary | ICD-10-CM | POA: Insufficient documentation

## 2011-04-13 HISTORY — DX: Personal history of other medical treatment: Z92.89

## 2011-04-13 HISTORY — DX: Other specified postprocedural states: Z98.890

## 2011-04-13 LAB — URINE MICROSCOPIC-ADD ON

## 2011-04-13 LAB — URINALYSIS, ROUTINE W REFLEX MICROSCOPIC
Glucose, UA: NEGATIVE mg/dL
Ketones, ur: NEGATIVE mg/dL
Protein, ur: NEGATIVE mg/dL
pH: 6 (ref 5.0–8.0)

## 2011-04-13 MED ORDER — PROMETHAZINE HCL 25 MG PO TABS: 25.0000 mg | ORAL_TABLET | Freq: Four times a day (QID) | ORAL | Status: DC | PRN

## 2011-04-13 NOTE — ED Provider Notes (Signed)
History   Pt presents today c/o vag bleeding in preg. She was recently seen at [facility] ED for the same and had a complete workup. She states she became worried when the blood went from a dark color to a brighter red. She denies severe abd pain, fever, or any other sx at this time. She denies recent intercourse.  Chief Complaint  Patient presents with  . Vaginal Bleeding   HPI  OB History    Grav Para Term Preterm Abortions TAB SAB Ect Mult Living   6 3 3  2 2    3       Past Medical History  Diagnosis Date  . Fibroids     uterine  . Anemia   . Obesity   . History of blood transfusion     after d&c  . H/O right breast biopsy     benign    Past Surgical History  Procedure Date  . Breast lumpectomy     Family History  Problem Relation Age of Onset  . Hypertension Mother   . Stroke Maternal Aunt   . Hypertension Maternal Grandmother     History  Substance Use Topics  . Smoking status: Never Smoker   . Smokeless tobacco: Not on file  . Alcohol Use: Yes    Allergies:  Allergies  Allergen Reactions  . Shellfish-Derived Products Itching and Other (See Comments)    Throat itching, etc.    Prescriptions prior to admission  Medication Sig Dispense Refill  . acetaminophen (TYLENOL) 500 MG tablet Take 500 mg by mouth every 6 (six) hours as needed. cramping      . ibuprofen (ADVIL,MOTRIN) 200 MG tablet Take 800 mg by mouth every 6 (six) hours as needed. For pain      . metroNIDAZOLE (FLAGYL) 500 MG tablet Take 1 tablet (500 mg total) by mouth 2 (two) times daily.  14 tablet  0  . DISCONTD: Prenatal Vit-Fe Fumarate-FA (PRENATAL MULTIVITAMIN) 60-1 MG tablet Take 1 tablet by mouth daily.  30 tablet  2    Review of Systems  Constitutional: Negative for fever and chills.  Eyes: Negative for blurred vision and double vision.  Cardiovascular: Negative for chest pain and palpitations.  Gastrointestinal: Negative for nausea, vomiting, abdominal pain, diarrhea and constipation.    Genitourinary: Negative for dysuria, urgency, frequency and hematuria.  Neurological: Negative for dizziness and headaches.  Psychiatric/Behavioral: Negative for depression and suicidal ideas.   Physical Exam   Blood pressure 109/66, pulse 87, temperature 97.3 F (36.3 C), temperature source Oral, resp. rate 18, last menstrual period 02/25/2011, SpO2 100.00%.  Physical Exam  Nursing note and vitals reviewed. Constitutional: She is oriented to person, place, and time. She appears well-developed and well-nourished. No distress.  HENT:  Head: Normocephalic and atraumatic.  Eyes: EOM are normal. Pupils are equal, round, and reactive to light.  GI: Soft. She exhibits no distension and no mass. There is no tenderness. There is no rebound and no guarding.  Neurological: She is alert and oriented to person, place, and time.  Skin: Skin is warm and dry. She is not diaphoretic.  Psychiatric: She has a normal mood and affect. Her behavior is normal. Judgment and thought content normal.    MAU Course  Procedures  Bedside US shows single IUP with cardiac activity.  Results for orders placed during the hospital encounter of 04/13/11 (from the past 24 hour(s))  URINALYSIS, ROUTINE W REFLEX MICROSCOPIC     Status: Abnormal   Collection Time  04/13/11  7:55 PM      Component Value Range   Color, Urine YELLOW  YELLOW    APPearance CLEAR  CLEAR    Specific Gravity, Urine >1.030 (*) 1.005 - 1.030    pH 6.0  5.0 - 8.0    Glucose, UA NEGATIVE  NEGATIVE (mg/dL)   Hgb urine dipstick MODERATE (*) NEGATIVE    Bilirubin Urine NEGATIVE  NEGATIVE    Ketones, ur NEGATIVE  NEGATIVE (mg/dL)   Protein, ur NEGATIVE  NEGATIVE (mg/dL)   Urobilinogen, UA 0.2  0.0 - 1.0 (mg/dL)   Nitrite NEGATIVE  NEGATIVE    Leukocytes, UA NEGATIVE  NEGATIVE   URINE MICROSCOPIC-ADD ON     Status: Abnormal   Collection Time   04/13/11  7:55 PM      Component Value Range   Squamous Epithelial / LPF FEW (*) RARE    RBC /  HPF 3-6  <3 (RBC/hpf)   Bacteria, UA RARE  RARE      Assessment and Plan  Bleeding in preg: discussed with pt at length. She is to begin prenatal care. Discussed diet, activity, risks, and precautions.  Clinton Gallant. Rice III, DrHSc, MPAS, PA-C  04/13/2011, 8:35 PM   Henrietta Hoover, PA 04/13/11 2047

## 2011-04-13 NOTE — Discharge Instructions (Signed)
Vaginal Bleeding During Pregnancy, First Trimester A small amount of bleeding (spotting) is relatively common in early pregnancy. It usually stops on its own. There are many causes for bleeding or spotting in early pregnancy. Some bleeding may be related to the pregnancy and some may not. Cramping with the bleeding is more serious and concerning. Tell your caregiver if you have any vaginal bleeding.  CAUSES   It is normal in most cases.   The pregnancy ends (miscarriage).   The pregnancy may end (threatened miscarriage).   Infection or inflammation of the cervix.   Growths (polyps) on the cervix.   Pregnancy happens outside of the uterus and in a fallopian tube (tubal pregnancy).   Many tiny cysts in the uterus instead of pregnancy tissue (molar pregnancy).  SYMPTOMS  Vaginal bleeding or spotting with or without cramps. DIAGNOSIS  To evaluate the pregnancy, your caregiver may:  Do a pelvic exam.   Take blood tests.   Do an ultrasound.  It is very important to follow your caregiver's instructions.  TREATMENT   Evaluation of the pregnancy with blood tests and ultrasound.   Bed rest (getting up to use the bathroom only).   Rho-gam immunization if the mother is Rh negative and the father is Rh positive.  HOME CARE INSTRUCTIONS   If your caregiver orders bed rest, you may need to make arrangements for the care of other children and for other responsibilities. However, your caregiver may allow you to continue light activity.   Keep track of the number of pads you use each day, how often you change pads and how soaked (saturated) they are. Write this down.   Do not use tampons. Do not douche.   Do not have sexual intercourse or orgasms until approved by your physician.   Save any tissue that you pass for your caregiver to see.   Take medicine for cramps only with your caregiver's permission.   Do not take aspirin because it can make you bleed.  SEEK IMMEDIATE MEDICAL CARE  IF:   You experience severe cramps in your stomach, back or belly (abdomen).   You have an oral temperature above 102 F (38.9 C), not controlled by medicine.   You pass large clots or tissue.   Your bleeding increases or you become light-headed, weak or have fainting episodes.   You develop chills.   You are leaking or have a gush of fluid from your vagina.   You pass out while having a bowel movement. That may mean you have a ruptured tubal pregnancy.  Document Released: 11/15/2004 Document Revised: 10/18/2010 Document Reviewed: 05/27/2008 [employer] Patient Information 2012 [employer]. 

## 2011-04-13 NOTE — Progress Notes (Signed)
Patient is here with c/o vaginal bleeding since Thursday last week. She states that it was brown spotting now its red. She does not have a pad on now and says that she gets a lot more blood when she wipes. She denies any pain. She states that she has n/v everyday and having harder stool. She is not on any medications.

## 2011-04-14 ENCOUNTER — Encounter (HOSPITAL_COMMUNITY): Payer: Self-pay | Admitting: Obstetrics and Gynecology

## 2011-04-14 ENCOUNTER — Inpatient Hospital Stay (HOSPITAL_COMMUNITY)
Admission: AD | Admit: 2011-04-14 | Discharge: 2011-04-14 | Disposition: A | Discharge status: Home / Self Care | Payer: Self-pay | Source: Ambulatory Visit | Attending: Obstetrics and Gynecology | Admitting: Obstetrics and Gynecology

## 2011-04-14 ENCOUNTER — Inpatient Hospital Stay (HOSPITAL_COMMUNITY): Payer: Self-pay

## 2011-04-14 DIAGNOSIS — O039 Complete or unspecified spontaneous abortion without complication: Secondary | ICD-10-CM

## 2011-04-14 LAB — CBC
MCV: 77.9 fL — ABNORMAL LOW (ref 78.0–100.0)
Platelets: 227 10*3/uL (ref 150–400)
RBC: 3.98 MIL/uL (ref 3.87–5.11)
WBC: 6.6 10*3/uL (ref 4.0–10.5)

## 2011-04-14 NOTE — Discharge Instructions (Signed)
SOMEONE WILL CALL YOU FROM THE GYN CLINIC TO SCHEDULE A FOLLOW UP APPOINTMENT. RETURN HERE AS NEEDED.

## 2011-04-14 NOTE — ED Provider Notes (Signed)
History     CSN: [id number]  Arrival date & time 04/14/11  1140   None     Chief Complaint  Patient presents with  . Vaginal Bleeding    HPI Katherine Hutchinson is a 36 y.o. female @ [redacted]w[redacted]d gestation who presents to MAU for vaginal bleeding. She had a visit in the [facility] ED 2/18 and at that time her Bhcg was 14,051 and ultrasound showed a 6 week IUP with FH of 141. She came to MAU 2/22 for continued bleeding and a bedside ultrasound was done and cardiac activity was again noted. She reports increased bleeding last night and passing clots this morning. Had cramping in lower abdomen when passing the clots. The history was provided by th patient and her previous chart.   Past Medical History  Diagnosis Date  . Fibroids     uterine  . Anemia   . Obesity   . History of blood transfusion     after d&c  . H/O right breast biopsy     benign    Past Surgical History  Procedure Date  . Breast lumpectomy     Family History  Problem Relation Age of Onset  . Hypertension Mother   . Stroke Maternal Aunt   . Hypertension Maternal Grandmother     History  Substance Use Topics  . Smoking status: Never Smoker   . Smokeless tobacco: Not on file  . Alcohol Use: Yes    OB History    Grav Para Term Preterm Abortions TAB SAB Ect Mult Living   6 3 3  2 2    3       Review of Systems  Constitutional: Negative for fever, chills, diaphoresis and fatigue.  HENT: Negative for ear pain, congestion, sore throat, facial swelling, neck pain, neck stiffness, dental problem and sinus pressure.   Eyes: Negative for photophobia, pain and discharge.  Respiratory: Negative for cough, chest tightness and wheezing.   Gastrointestinal: Positive for abdominal pain (cramping). Negative for nausea, vomiting, diarrhea, constipation and abdominal distention.  Genitourinary: Positive for vaginal bleeding. Negative for dysuria, frequency, flank pain and difficulty urinating.  Musculoskeletal: Negative for myalgias,  back pain and gait problem.  Skin: Negative for color change and rash.  Neurological: Negative for dizziness, speech difficulty, weakness, light-headedness, numbness and headaches.  Psychiatric/Behavioral: Negative for confusion and agitation.    Allergies  Shellfish-derived products  Home Medications  No current outpatient prescriptions on file.  BP 113/58  Pulse 91  Temp(Src) 99.1 F (37.3 C) (Oral)  Resp 16  Ht 5\' 5"  (1.651 m)  Wt 233 lb (105.688 kg)  BMI 38.77 kg/m2  LMP 02/25/2011  Physical Exam  Nursing note and vitals reviewed. Constitutional: She is oriented to person, place, and time.       obese  HENT:  Head: Normocephalic.  Eyes: EOM are normal.  Neck: Neck supple.  Cardiovascular: Normal rate.   Pulmonary/Chest: Effort normal.  Abdominal: Soft. There is no tenderness.  Genitourinary:       External genitalia without lesions. Small amount of blood in vaginal vault. Cervix closed, no CMT, no adnexal tenderness, no palpable enlargement of uterus.  Musculoskeletal: Normal range of motion.  Neurological: She is alert and oriented to person, place, and time. No cranial nerve deficit.  Skin: Skin is warm and dry.  Psychiatric: Her behavior is normal. Judgment and thought content normal. Her mood appears anxious.   Results for orders placed during the hospital encounter of 04/14/11 (from the  past 24 hour(s))  CBC     Status: Abnormal   Collection Time   04/14/11 12:20 PM      Component Value Range   WBC 6.6  4.0 - 10.5 (K/uL)   RBC 3.98  3.87 - 5.11 (MIL/uL)   Hemoglobin 9.7 (*) 12.0 - 15.0 (g/dL)   HCT 95.6 (*) 21.3 - 46.0 (%)   MCV 77.9 (*) 78.0 - 100.0 (fL)   MCH 24.4 (*) 26.0 - 34.0 (pg)   MCHC 31.3  30.0 - 36.0 (g/dL)   RDW 08.6  57.8 - 46.9 (%)   Platelets 227  150 - 400 (K/uL)   Blood type O positive  US Ob Transvaginal  04/14/2011  *RADIOLOGY REPORT*  Clinical Data: Heavy menstrual bleeding.  Recent ultrasound demonstrating an IUP.  TRANSVAGINAL  OBSTETRIC US 04/14/2011:  Technique:  Transvaginal ultrasound was performed for complete evaluation of the gestation as well as the maternal uterus, adnexal regions, and pelvic cul-de-sac.  Comparison:  Early OB ultrasound 04/09/2011.  Intrauterine gestational sac: Not visualized. Yolk sac: Not visualized. Embryo: Not visualized. Cardiac Activity: Not visualized. Heart Rate: Not applicable.  MSD: Not applicable. CRL: Not applicable. [place] EDC: Not applicable.  Maternal uterus/adnexae: Marked thickening and heterogeneity of the endometrium, measuring up to approximately 28 mm.  Power Doppler signal within the heterogeneous endometrium.  Normal-appearing ovaries bilaterally containing normal power Doppler signal, the right measuring approximate 3.5 x 3.1 x 2.7 cm and the left measuring approximately 3.1 x 2.8 x 2.8 cm.  No adnexal masses or free pelvic fluid.  IMPRESSION:  1.  Findings consistent with spontaneous abortion in progress, with suggestion of retained products of conception. 2.  Normal-appearing ovaries.  No adnexal masses or free pelvic fluid.  Original Report Authenticated By: Arnell Sieving, M.D.   ED Course: discussed results with patient and treatment options patient will do expectant management and follow up in GYN Clinic.  Procedures Assessment: SAB in progress  Plan:  Expectant management and f/u in GYN Clinic   Comfort packet given  MDM          Kerrie Buffalo, NP 04/14/11 1323

## 2011-04-14 NOTE — Progress Notes (Signed)
Was seen last night for bleeding during pregnancy, last night worsened around 1:30 a.m., some cramping.  Passing clots

## 2011-04-16 NOTE — ED Provider Notes (Signed)
Agree with above note.  Katherine Hutchinson 04/16/2011 11:47 AM

## 2011-04-19 ENCOUNTER — Encounter (HOSPITAL_COMMUNITY): Payer: Self-pay | Admitting: Anesthesiology

## 2011-04-19 ENCOUNTER — Ambulatory Visit (HOSPITAL_COMMUNITY)
Admission: AD | Admit: 2011-04-19 | Discharge: 2011-04-19 | Disposition: A | Discharge status: Home / Self Care | Payer: Self-pay | Source: Ambulatory Visit | Attending: Obstetrics & Gynecology | Admitting: Obstetrics & Gynecology

## 2011-04-19 ENCOUNTER — Encounter (HOSPITAL_COMMUNITY): Payer: Self-pay | Admitting: *Deleted

## 2011-04-19 ENCOUNTER — Inpatient Hospital Stay (HOSPITAL_COMMUNITY): Payer: Self-pay

## 2011-04-19 ENCOUNTER — Encounter (HOSPITAL_COMMUNITY)
Admission: AD | Disposition: A | Discharge status: Home / Self Care | Payer: Self-pay | Source: Ambulatory Visit | Attending: Obstetrics & Gynecology

## 2011-04-19 ENCOUNTER — Inpatient Hospital Stay (HOSPITAL_COMMUNITY): Payer: Self-pay | Admitting: Anesthesiology

## 2011-04-19 DIAGNOSIS — O034 Incomplete spontaneous abortion without complication: Principal | ICD-10-CM | POA: Insufficient documentation

## 2011-04-19 DIAGNOSIS — IMO0002 Reserved for concepts with insufficient information to code with codable children: Secondary | ICD-10-CM

## 2011-04-19 HISTORY — DX: Chlamydial infection, unspecified: A74.9

## 2011-04-19 HISTORY — PX: DILATION AND EVACUATION: SHX1459

## 2011-04-19 LAB — CBC
HCT: 26.7 % — ABNORMAL LOW (ref 36.0–46.0)
Hemoglobin: 8.2 g/dL — ABNORMAL LOW (ref 12.0–15.0)
MCV: 79.2 fL (ref 78.0–100.0)
Platelets: 230 10*3/uL (ref 150–400)
RBC: 3.37 MIL/uL — ABNORMAL LOW (ref 3.87–5.11)
WBC: 8.1 10*3/uL (ref 4.0–10.5)

## 2011-04-19 SURGERY — DILATION AND EVACUATION, UTERUS
Anesthesia: General | Site: Vagina | Wound class: Clean Contaminated

## 2011-04-19 MED ORDER — DEXAMETHASONE SODIUM PHOSPHATE 10 MG/ML IJ SOLN
INTRAMUSCULAR | Status: DC | PRN
  Administered 2011-04-19: 10 mg via INTRAVENOUS

## 2011-04-19 MED ORDER — ONDANSETRON HCL 4 MG/2ML IJ SOLN
INTRAMUSCULAR | Status: DC | PRN
  Administered 2011-04-19: 4 mg via INTRAVENOUS

## 2011-04-19 MED ORDER — FENTANYL CITRATE 0.05 MG/ML IJ SOLN
INTRAMUSCULAR | Status: DC | PRN
  Administered 2011-04-19: 100 ug via INTRAVENOUS

## 2011-04-19 MED ORDER — LIDOCAINE HCL (CARDIAC) 20 MG/ML IV SOLN
INTRAVENOUS | Status: DC | PRN
  Administered 2011-04-19: 80 mg via INTRAVENOUS

## 2011-04-19 MED ORDER — IBUPROFEN 600 MG PO TABS: 600.0000 mg | ORAL_TABLET | Freq: Four times a day (QID) | ORAL | Status: AC | PRN

## 2011-04-19 MED ORDER — 0.9 % SODIUM CHLORIDE (POUR BTL) OPTIME
TOPICAL | Status: DC | PRN
  Administered 2011-04-19: 1000 mL

## 2011-04-19 MED ORDER — CITRIC ACID-SODIUM CITRATE 334-500 MG/5ML PO SOLN
30.0000 mL | Freq: Once | ORAL | Status: AC
  Administered 2011-04-19: 30 mL via ORAL
  Filled 2011-04-19: qty 15

## 2011-04-19 MED ORDER — DOXYCYCLINE HYCLATE 100 MG IV SOLR
100.0000 mg | Freq: Once | INTRAVENOUS | Status: AC
  Administered 2011-04-19: 100 mg via INTRAVENOUS
  Filled 2011-04-19: qty 100

## 2011-04-19 MED ORDER — FENTANYL CITRATE 0.05 MG/ML IJ SOLN: 25.0000 ug | INTRAMUSCULAR | Status: DC | PRN

## 2011-04-19 MED ORDER — PROPOFOL 10 MG/ML IV EMUL
INTRAVENOUS | Status: DC | PRN
  Administered 2011-04-19: 200 mg via INTRAVENOUS

## 2011-04-19 MED ORDER — OXYCODONE-ACETAMINOPHEN 5-325 MG PO TABS: 1.0000 | ORAL_TABLET | ORAL | Status: AC | PRN

## 2011-04-19 MED ORDER — MIDAZOLAM HCL 5 MG/5ML IJ SOLN
INTRAMUSCULAR | Status: DC | PRN
  Administered 2011-04-19: 2 mg via INTRAVENOUS

## 2011-04-19 MED ORDER — KETOROLAC TROMETHAMINE 60 MG/2ML IM SOLN
60.0000 mg | Freq: Once | INTRAMUSCULAR | Status: AC
  Administered 2011-04-19: 60 mg via INTRAMUSCULAR
  Filled 2011-04-19: qty 2

## 2011-04-19 MED ORDER — LACTATED RINGERS IV SOLN
INTRAVENOUS | Status: DC
  Administered 2011-04-19 (×2): via INTRAVENOUS

## 2011-04-19 SURGICAL SUPPLY — 19 items
CATH ROBINSON RED A/P 16FR (CATHETERS) ×2 IMPLANT
CLOTH BEACON ORANGE TIMEOUT ST (SAFETY) ×2 IMPLANT
DECANTER SPIKE VIAL GLASS SM (MISCELLANEOUS) ×2 IMPLANT
GLOVE BIO SURGEON STRL SZ7 (GLOVE) ×2 IMPLANT
GLOVE BIOGEL PI IND STRL 7.0 (GLOVE) ×2 IMPLANT
GLOVE BIOGEL PI INDICATOR 7.0 (GLOVE) ×2
GOWN PREVENTION PLUS LG XLONG (DISPOSABLE) ×4 IMPLANT
KIT BERKELEY 1ST TRIMESTER 3/8 (MISCELLANEOUS) ×2 IMPLANT
NEEDLE SPNL 22GX3.5 QUINCKE BK (NEEDLE) ×2 IMPLANT
NS IRRIG 1000ML POUR BTL (IV SOLUTION) ×2 IMPLANT
PACK VAGINAL MINOR WOMEN LF (CUSTOM PROCEDURE TRAY) ×2 IMPLANT
PAD PREP 24X48 CUFFED NSTRL (MISCELLANEOUS) ×2 IMPLANT
SET BERKELEY SUCTION TUBING (SUCTIONS) ×2 IMPLANT
SYR CONTROL 10ML LL (SYRINGE) ×2 IMPLANT
TOWEL OR 17X24 6PK STRL BLUE (TOWEL DISPOSABLE) ×4 IMPLANT
VACURETTE 10 RIGID CVD (CANNULA) IMPLANT
VACURETTE 7MM CVD STRL WRAP (CANNULA) IMPLANT
VACURETTE 8 RIGID CVD (CANNULA) ×2 IMPLANT
VACURETTE 9 RIGID CVD (CANNULA) IMPLANT

## 2011-04-19 NOTE — Anesthesia Preprocedure Evaluation (Addendum)
Anesthesia Evaluation  Patient identified by MRN, date of birth, ID band Patient awake    Reviewed: Allergy & Precautions, H&P , Patient's Chart, lab work & pertinent test results, reviewed documented beta blocker date and time   Airway Mallampati: II TM Distance: >3 FB Neck ROM: full    Dental No notable dental hx.    Pulmonary  clear to auscultation  Pulmonary exam normal       Cardiovascular regular Normal    Neuro/Psych    GI/Hepatic   Endo/Other  Morbid obesity  Renal/GU      Musculoskeletal   Abdominal   Peds  Hematology   Anesthesia Other Findings   Reproductive/Obstetrics                           Anesthesia Physical Anesthesia Plan  ASA: III  Anesthesia Plan: General   Post-op Pain Management:    Induction: Intravenous  Airway Management Planned: LMA  Additional Equipment:   Intra-op Plan:   Post-operative Plan:   Informed Consent: I have reviewed the patients History and Physical, chart, labs and discussed the procedure including the risks, benefits and alternatives for the proposed anesthesia with the patient or authorized representative who has indicated his/her understanding and acceptance.   Dental Advisory Given  Plan Discussed with: CRNA and Surgeon  Anesthesia Plan Comments: (  Discussed  general anesthesia, including possible nausea, instrumentation of airway, sore throat,pulmonary aspiration, etc. I asked if the were any outstanding questions, or  concerns before we proceeded. )       Anesthesia Quick Evaluation  

## 2011-04-19 NOTE — Op Note (Signed)
[name] PROCEDURE DATE: 04/19/2011  PREOPERATIVE DIAGNOSIS: Retained products of conception after spontaneous miscarriage. POSTOPERATIVE DIAGNOSIS: The same. PROCEDURE:     Dilation and Evacuation. SURGEON:  Dr. Elsie Lincoln  INDICATIONS: 36 y.o. [phone] retained products of conception after 1st trimester spontaneous miscarriage, needing surgical completion.  Risks of surgery were discussed with the patient including but not limited to: bleeding which may require transfusion; infection which may require antibiotics; injury to uterus or surrounding organs;need for additional procedures including laparotomy or laparoscopy; possibility of intrauterine scarring which may impair future fertility; and other postoperative/anesthesia complications. Written informed consent was obtained.    FINDINGS:  A 12 size anteverted uterus with multiple fibroids, moderate amounts of products of conception, specimen sent to pathology.  ANESTHESIA:    General LMA. ESTIMATED BLOOD LOSS:  Less than 20 ml. SPECIMENS:  Products of conception sent to pathology COMPLICATIONS:  None immediate.  PROCEDURE DETAILS:  The patient received intravenous antibiotics while in the preoperative area.  She was then taken to the operating room where anesthesia was administered and was found to be adequate.  After an adequate timeout was performed, she was placed in the dorsal lithotomy position and examined; then prepped and draped in the sterile manner.   Her bladder was catheterized for an unmeasured amount of clear, yellow urine. A vaginal speculum was then placed in the patient's vagina and a single tooth tenaculum was applied to the anterior lip of the cervix. The cervix was gently dilated to accommodate a 8 mm suction curette that was gently advanced to the uterine fundus.  The suction device was then activated and curette slowly rotated to clear the uterus of products of conception.  A sharp curettage was then performed to  confirm complete emptying of the uterus. There was minimal bleeding noted and the tenaculum removed with good hemostasis noted.   All instruments were removed from the patient's vagina. The patient tolerated the procedure well and was taken to the recovery area awake, and in stable condition.  The patient will be discharged to home as per PACU criteria.  Routine postoperative instructions given.  She was prescribed Percocet and Ibuprofen.  She will follow up in the clinic in 2-3 weeks for postoperative evaluation.

## 2011-04-19 NOTE — ED Provider Notes (Signed)
History     CSN: [id number]  Arrival date & time 04/19/11  0865   None     Chief Complaint  Patient presents with  . Abdominal Pain  . Miscarriage   HPI Katherine Hutchinson is a 36 y.o. female who presents to MAU for lower abdominal cramping. She was evaluated 5 days ago for SAB and offered cytotec or expectant management. She requested expectant management and has an appointment for follow up in GYN Clinic. She reports that this morning bleeding and pain increased. She took tylenol without relief. The history was provided by the patient.  Past Medical History  Diagnosis Date  . Fibroids     uterine  . Anemia   . Obesity   . History of blood transfusion     after d&c  . H/O right breast biopsy     benign  . Chlamydia     Past Surgical History  Procedure Date  . Dilation and curettage, diagnostic / therapeutic   . Breast lumpectomy     benign    Family History  Problem Relation Age of Onset  . Hypertension Mother   . Stroke Maternal Aunt   . Hypertension Maternal Grandmother   . Anesthesia problems Neg Hx     History  Substance Use Topics  . Smoking status: Never Smoker   . Smokeless tobacco: Never Used  . Alcohol Use: No    OB History    Grav Para Term Preterm Abortions TAB SAB Ect Mult Living   6 3 3  2 2    3       Review of Systems  Constitutional: Negative for fever and chills.  Eyes: Negative.   Respiratory: Negative for cough and wheezing.   Cardiovascular: Negative.   Gastrointestinal: Positive for abdominal pain. Negative for nausea, vomiting, diarrhea and constipation.  Genitourinary: Positive for vaginal bleeding. Negative for dysuria, urgency and pelvic pain.  Musculoskeletal: Positive for back pain.  Neurological: Positive for headaches. Negative for dizziness.  Psychiatric/Behavioral: Negative for behavioral problems and confusion.    Allergies  Shellfish-derived products  Home Medications  No current outpatient prescriptions on  file.  BP 129/92  Pulse 90  Temp(Src) 99.2 F (37.3 C) (Oral)  Resp 20  Ht 5\' 5"  (1.651 m)  Wt 237 lb (107.502 kg)  BMI 39.44 kg/m2  LMP 02/25/2011  Physical Exam  Nursing note and vitals reviewed. Constitutional: She is oriented to person, place, and time. She appears well-developed and well-nourished.  HENT:  Head: Normocephalic.  Eyes: EOM are normal.  Neck: Neck supple.  Cardiovascular: Normal rate.   Pulmonary/Chest: Effort normal.  Abdominal: Soft. There is tenderness. There is no rigidity, no rebound, no guarding and no CVA tenderness.       Lower abdominal pain that is mild.   Musculoskeletal: Normal range of motion.  Neurological: She is alert and oriented to person, place, and time. No cranial nerve deficit.  Skin: Skin is warm and dry.  Psychiatric: She has a normal mood and affect. Her behavior is normal. Judgment and thought content normal.   Results for orders placed during the hospital encounter of 04/19/11 (from the past 24 hour(s))  CBC     Status: Abnormal   Collection Time   04/19/11  9:20 AM      Component Value Range   WBC 8.1  4.0 - 10.5 (K/uL)   RBC 3.37 (*) 3.87 - 5.11 (MIL/uL)   Hemoglobin 8.2 (*) 12.0 - 15.0 (g/dL)   HCT  26.7 (*) 36.0 - 46.0 (%)   MCV 79.2  78.0 - 100.0 (fL)   MCH 24.3 (*) 26.0 - 34.0 (pg)   MCHC 30.7  30.0 - 36.0 (g/dL)   RDW 16.1 (*) 09.6 - 15.5 (%)   Platelets 230  150 - 400 (K/uL)  HCG, QUANTITATIVE, PREGNANCY     Status: Abnormal   Collection Time   04/19/11  9:20 AM      Component Value Range   hCG, Beta Chain, Quant, S 619 (*) <5 (mIU/mL)    ED Course: discussed with Dr. Penne Lash and we will repeat Bhcg today and if elevated will offer patient Cytotec.  Procedures Hgb. Has dropped to 8.1, discussed with Dr. Penne Lash. Will repeat u/s.  Pt has 1.5 cm endometrial lining with evidence of retained products of conception.  Pt's hgb is 8.2.  Pt needs D & E for retained products of conception.  Pt consented for D & E.  Risks  include but not limited to bleeding, infection, and damage to intraabdominal organs.  Very rare chance of laparotomy in the case of damage to surrounding organs.  Risk of anesthesia dn DVT  / PE also explained.  Pt needs to go pick up her children from school.  Pt will return in one hour for procedure.

## 2011-04-19 NOTE — ED Notes (Signed)
Pt returns, after getting childcare situated.  Taken to rm. Given PAWS gown and footies.  CHG wipes.  Bags for belongings plan discussed.

## 2011-04-19 NOTE — Discharge Instructions (Signed)
Dilation and Curettage or Vacuum Curettage Dilation and curettage (D&C) and vacuum curettage are minor procedures. A D&C involves stretching (dilation) the cervix and scraping (curettage) the inside lining of the womb (uterus). During a D&C, tissue is gently scraped from the inside lining of the uterus. During a vacuum curettage, the lining and tissue in the uterus are removed with the use of gentle suction. Curettage may be performed for diagnostic or therapeutic purposes. As a diagnostic procedure, curettage is performed for the purpose of examining tissues from the uterus. Tissue examination may help determine causes or treatment options for symptoms. A diagnostic curettage may be performed for the following symptoms:  Irregular bleeding in the uterus.   Bleeding with the development of clots.   Spotting between menstrual periods.   Prolonged menstrual periods.   Bleeding after menopause.   No menstrual period (amenorrhea).   A change in size and shape of the uterus.  A therapeutic curettage is performed to remove tissue, blood, or a contraceptive device. Therapeutic curettage may be performed for the following conditions:   Removal of an IUD (intrauterine device).   Removal of retained placenta after giving birth. Retained placenta can cause bleeding severe enough to require transfusions or an infection.   Abortion.   Miscarriage.   Removal of polyps inside the uterus.   Removal of uncommon types of fibroids (noncancerous lumps).  LET YOUR CAREGIVER KNOW ABOUT:   Allergies to food or medicine.   Medicines taken, including vitamins, herbs, eyedrops, over-the-counter medicines, and creams.   Use of steroids (by mouth or creams).   Previous problems with anesthetics or numbing medicines.   History of bleeding problems or blood clots.   Previous surgery.   Other health problems, including diabetes and kidney problems.   Possibility of pregnancy, if this applies.  RISKS  AND COMPLICATIONS   Excessive bleeding.   Infection of the uterus.   Damage to the cervix.   Development of scar tissue (adhesions) inside the uterus, later causing abnormal amounts of menstrual bleeding.   Complications from the general anesthetic, if a general anesthetic is used.   Putting a hole (perforation) in the uterus. This is rare.  BEFORE THE PROCEDURE   Eat and drink before the procedure only as directed by your caregiver.   Arrange for someone to take you home.  PROCEDURE   This procedure may be done in a hospital, outpatient clinic, or caregiver's office.   You may be given a general anesthetic or a local anesthetic in and around the cervix.   You will lie on your back with your legs in stirrups.   There are two ways in which your cervix can be softened and dilated. These include:   Taking a medicine.   Having thin rods (laminaria) inserted into your cervix.   A curved tool (curette) will scrape cells from the inside lining of the uterus and will then be removed.  This procedure usually takes about 15 to 30 minutes. AFTER THE PROCEDURE   You will rest in the recovery area until you are stable and are ready to go home.   You will need to have someone take you home.   You may feel sick to your stomach (nauseous) or throw up (vomit) if you had general anesthesia.   You may have a sore throat if a tube was placed in your throat during general anesthesia.   You may have light cramping and bleeding for 2 days to 2 weeks after the procedure.     Your uterus needs to make a new lining after the procedure. This may make your next period late.  Document Released: 02/05/2005 Document Revised: 10/18/2010 Document Reviewed: 09/03/2008 [facility] Patient Information 2012 [place], [place].DISCHARGE INSTRUCTIONS: D&C / D&E The following instructions have been prepared to help you care for yourself upon your return home.   Personal hygiene: Marland Kitchen Use sanitary pads for vaginal  drainage, not tampons. . Shower the day after your procedure. . NO tub baths, pools or Jacuzzis for 2-3 weeks. . Wipe front to back after using the bathroom.  Activity and limitations: . Do NOT drive or operate any equipment for 24 hours. The effects of anesthesia are still present and drowsiness may result. . Do NOT rest in bed all day. . Walking is encouraged. . Walk up and down stairs slowly. . You may resume your normal activity in one to two days or as indicated by your physician.  Sexual activity: NO intercourse for at least 2 weeks after the procedure, or as indicated by your physician.  Diet: Eat a light meal as desired this evening. You may resume your usual diet tomorrow.  Return to work: You may resume your work activities in one to two days or as indicated by your doctor.  What to expect after your surgery: Expect to have vaginal bleeding/discharge for 2-3 days and spotting for up to 10 days. It is not unusual to have soreness for up to 1-2 weeks. You may have a slight burning sensation when you urinate for the first day. Mild cramps may continue for a couple of days. You may have a regular period in 2-6 weeks.  Call your doctor for any of the following: . Excessive vaginal bleeding, saturating and changing one pad every hour. . Inability to urinate 6 hours after discharge from hospital. . Pain not relieved by pain medication. . Fever of 100.4 F or greater. . Unusual vaginal discharge or odor.  Return to office ________________ Call for an appointment ___________________  Patient's signature: ______________________  Nurse's signature ________________________  Post Anesthesia Care Unit [phone]

## 2011-04-19 NOTE — ED Notes (Signed)
Dr Penne Lash in consenting pt for [facility].  Pt states needs to go home and arrange childcare. OK per MD. Planning for 1500.

## 2011-04-19 NOTE — Progress Notes (Signed)
Here about a week ago, dx with miscarriage, plan to let come out naturally.  Cramping has continued, getting worse- tylenol not helping.  Was told everything was basically passed.

## 2011-04-19 NOTE — Transfer of Care (Signed)
Immediate Anesthesia Transfer of Care Note  Patient: Katherine Hutchinson  Procedure(s) Performed: Procedure(s) (LRB): DILATATION AND EVACUATION (N/A)  Patient Location: PACU  Anesthesia Type: General  Level of Consciousness: alert  and oriented  Airway & Oxygen Therapy: Patient Spontanous Breathing  Post-op Assessment: Report given to PACU RN and Post -op Vital signs reviewed and stable  Post vital signs: stable  Complications: No apparent anesthesia complications

## 2011-04-20 ENCOUNTER — Encounter (HOSPITAL_COMMUNITY): Payer: Self-pay | Admitting: Obstetrics & Gynecology

## 2011-04-30 ENCOUNTER — Ambulatory Visit: Payer: Self-pay | Admitting: Obstetrics and Gynecology

## 2011-05-02 ENCOUNTER — Encounter: Payer: Self-pay | Admitting: Obstetrics & Gynecology

## 2011-05-02 ENCOUNTER — Ambulatory Visit (INDEPENDENT_AMBULATORY_CARE_PROVIDER_SITE_OTHER): Payer: Self-pay | Admitting: Obstetrics & Gynecology

## 2011-05-02 VITALS — BP 132/83 | HR 76 | Temp 97.1°F | Ht 65.0 in | Wt 230.8 lb

## 2011-05-02 DIAGNOSIS — Z09 Encounter for follow-up examination after completed treatment for conditions other than malignant neoplasm: Secondary | ICD-10-CM

## 2011-05-02 MED ORDER — ETONOGESTREL-ETHINYL ESTRADIOL 0.12-0.015 MG/24HR VA RING: VAGINAL_RING | VAGINAL | Status: DC

## 2011-05-02 NOTE — Progress Notes (Signed)
  Subjective:    Patient ID: Katherine Hutchinson, female    DOB: 28-Oct-1975, 36 y.o.   MRN: [id number]  HPI  Katherine Hutchinson is now about 3 weeks status post d&c done because of retained products after a spontaneous miscarriage (first trimester). She has no complaints. She would like to restart Nuvaring. She wants to conceive sometime later this year. She has not had sex since her surgery.  Review of Systems Pap normal 1/13    Objective:   Physical Exam        Assessment & Plan:  Restart Nuvaring. Condoms for next month BP check in 2 weeks Rec folic acid daily to decrease risk of ONTDs.

## 2011-05-21 DEATH — deceased

## 2011-06-12 ENCOUNTER — Telehealth: Payer: Self-pay | Admitting: *Deleted

## 2011-06-12 NOTE — Telephone Encounter (Signed)
Pt called - has a rash since 06/09/11 upper right lip. Took Diflucan from health dept 06/08/11. Had same problem 03/2011 with Diflucan.  Appt made 06/13/11 9:45AM Dr Scot Dock. Stanton Kidney Elvia Aydin RN 06/12/11 12N

## 2011-06-13 ENCOUNTER — Ambulatory Visit: Payer: Self-pay | Admitting: Internal Medicine

## 2011-06-14 ENCOUNTER — Encounter: Payer: Self-pay | Admitting: Internal Medicine

## 2011-06-14 ENCOUNTER — Ambulatory Visit (INDEPENDENT_AMBULATORY_CARE_PROVIDER_SITE_OTHER): Payer: Self-pay | Admitting: Internal Medicine

## 2011-06-14 ENCOUNTER — Other Ambulatory Visit: Payer: Self-pay | Admitting: Family Medicine

## 2011-06-14 ENCOUNTER — Other Ambulatory Visit: Payer: Self-pay | Admitting: Internal Medicine

## 2011-06-14 VITALS — BP 126/79 | HR 81 | Temp 97.5°F | Ht 65.0 in | Wt 235.8 lb

## 2011-06-14 DIAGNOSIS — L270 Generalized skin eruption due to drugs and medicaments taken internally: Secondary | ICD-10-CM | POA: Insufficient documentation

## 2011-06-14 DIAGNOSIS — N63 Unspecified lump in unspecified breast: Secondary | ICD-10-CM

## 2011-06-14 MED ORDER — DIPHENHYDRAMINE-ZINC ACETATE 1-0.1 % EX CREA: TOPICAL_CREAM | Freq: Three times a day (TID) | CUTANEOUS | Status: DC | PRN

## 2011-06-14 NOTE — Patient Instructions (Signed)
Drug Rash Skin reactions can be caused by several different drugs. Allergy to the medicine can cause itching, hives, and other rashes. Sun exposure causes a red rash with some medicines. Mononucleosis virus can cause a similar red rash when you are taking antibiotics. Sometimes, the rash may be accompanied by pain. The drug rash may happen with new drugs or with medicines that you have been taking for a while. The rash cannot be spread from person to person. In most cases, the symptoms of a drug rash are gone within a few days of stopping the medicine. Your rash, including hives (urticaria), is most likely from the following medicines:  Antibiotics or antimicrobials.   Anticonvulsants or seizure medicines.   Antihypertensives or blood pressure medicines.   Antimalarials.   Antidepressants or depression medicines.   Antianxiety drugs.   Diuretics or water pills.   Nonsteroidal anti-inflammatory drugs.   Simvastatin.   Lithium.   Omeprazole.   Allopurinol.   Pseudoephedrine.   Amiodarone.   Packed red blood cells, when you get a blood transfusion.   Contrast media, such as when getting an imaging test (CT or CAT scan).  This drug list is not all inclusive, but drug rashes have been reported with all the medicines listed above.Your caregiver will tell you which medicines to avoid. If you react to a medicine, a similar or worse reaction can occur the next time you take it. If you need to stop taking an antibiotic because of a drug rash, an alternative antibiotic may be needed to get rid of your infection. Antihistamine or cortisone drugs may be prescribed to help relieve your symptoms. Stay out of the sun until the rash is completely gone.  Be sure to let your caregiver know about your drug reaction. Do not take this medicine in the future. Call your caregiver if your drug rash does not improve within 3 to 4 days. SEEK IMMEDIATE MEDICAL CARE IF:   You develop breathing problems,  swelling in the throat, or wheezing.   You have weakness, fainting, fever, and muscle or joint pains.   You develop blisters or peeling of skin, especially around the mouth.  Document Released: 03/15/2004 Document Revised: 01/25/2011 Document Reviewed: 12/25/2007 [employer] Patient Information 2012 [employer]. 

## 2011-06-14 NOTE — Progress Notes (Signed)
Subjective:     Patient ID: Katherine Hutchinson, female   DOB: 04-26-1975, 36 y.o.   MRN: [id number]  HPI  36 y/o w comes to get a rash on her left upper lip evaluated Started after she took diflucan for vaginal yeast infection It happened the second time, she took diflucan in 2 months ago and she had the same rash at same place at that time. It  Does not itch or hurt, just has a scratchy feel and she keeps  Rubbing it at night in her sleep She stopped taking the diflucan and uses topical yeast cream on it, It is improving now for past 2 days No other rash, immunosuppression, fever, chills or other complaints.    Review of Systems  As per HPI   Objective:   Physical Exam  Skin:       CXR- RRR Skin- no other rash except shown in picture     Assessment:     Rash in same spot twice after starting diflucan Possible drug rash     Plan:     Topical benadryl Patient wants to go to dermatology so will try to get that arranged with her orange card.

## 2011-06-18 ENCOUNTER — Encounter: Payer: Self-pay | Admitting: Internal Medicine

## 2011-06-18 NOTE — Progress Notes (Signed)
Patient ID: Katherine Hutchinson, female   DOB: May 16, 1975, 36 y.o.   MRN: [id number]   I was asked to cosign an order for a mammogram on this patient to follow up an abnormality seen on prior mammogram.  I have asked patient's PCP Dr. Milbert Coulter to make sure the mammogram is done and has appropriate mammographic and clinical evaluation and follow up, including a follow up breast exam and any needed referral.

## 2011-06-20 ENCOUNTER — Ambulatory Visit
Admission: RE | Admit: 2011-06-20 | Discharge: 2011-06-20 | Disposition: A | Discharge status: Home / Self Care | Payer: Self-pay | Source: Ambulatory Visit | Attending: Internal Medicine | Admitting: Internal Medicine

## 2011-06-20 DIAGNOSIS — N63 Unspecified lump in unspecified breast: Secondary | ICD-10-CM

## 2011-07-04 NOTE — Progress Notes (Signed)
I have discussed mammogram with patient, and she is aware of necessity for 44month follow up.

## 2011-09-12 ENCOUNTER — Encounter: Payer: Self-pay | Admitting: Internal Medicine

## 2011-09-26 ENCOUNTER — Encounter: Payer: Self-pay | Admitting: Internal Medicine

## 2011-10-10 NOTE — Anesthesia Postprocedure Evaluation (Signed)
  Anesthesia Post-op Note  Patient: Katherine Hutchinson  Procedure(s) Performed: Procedure(s) (LRB): DILATATION AND EVACUATION (N/A) Patient is awake and responsive. Pain and nausea are reasonably well controlled. Vital signs are stable and clinically acceptable. Oxygen saturation is clinically acceptable. There are no apparent anesthetic complications at this time. Patient is ready for discharge.

## 2011-11-07 ENCOUNTER — Encounter: Payer: Self-pay | Admitting: Internal Medicine

## 2011-11-07 ENCOUNTER — Ambulatory Visit (INDEPENDENT_AMBULATORY_CARE_PROVIDER_SITE_OTHER): Payer: Self-pay | Admitting: Internal Medicine

## 2011-11-07 VITALS — BP 131/81 | HR 72 | Temp 97.3°F | Wt 239.6 lb

## 2011-11-07 DIAGNOSIS — R21 Rash and other nonspecific skin eruption: Secondary | ICD-10-CM

## 2011-11-07 DIAGNOSIS — D509 Iron deficiency anemia, unspecified: Secondary | ICD-10-CM

## 2011-11-07 LAB — CBC
HCT: 28.9 % — ABNORMAL LOW (ref 36.0–46.0)
Hemoglobin: 9 g/dL — ABNORMAL LOW (ref 12.0–15.0)
MCV: 72.3 fL — ABNORMAL LOW (ref 78.0–100.0)
RBC: 4 MIL/uL (ref 3.87–5.11)
WBC: 6 10*3/uL (ref 4.0–10.5)

## 2011-11-07 MED ORDER — TRIAMCINOLONE ACETONIDE 0.1 % EX CREA: TOPICAL_CREAM | Freq: Four times a day (QID) | CUTANEOUS | Status: DC | PRN

## 2011-11-07 NOTE — Patient Instructions (Signed)
-  Apply triamcinolone cream to affected area 4 times daily for the next 2 weeks - may discontinue if rash resolves  -Return in 2 weeks to follow up rash - I want Dr. Lonzo Cloud to see you.  Please be sure to bring all of your medications with you to every visit.  Should you have any new or worsening symptoms, please be sure to call the clinic at [phone].

## 2011-11-07 NOTE — Assessment & Plan Note (Signed)
CBC today.  Hb had been trending down in setting of miscarriage.

## 2011-11-07 NOTE — Assessment & Plan Note (Addendum)
On soles of hands & feet, as well as on right anterior mid shin.  No significant change in shape/size per patient, but she is increasingly concerned about melanoma.   RPR in January was negative.  Trial of Kenalog cream.  If patient cannot afford kenalog cream, instructed to try OTC hydrocortisone.  Return in 2 weeks.

## 2011-11-07 NOTE — Progress Notes (Signed)
Subjective:   Patient ID: Katherine Hutchinson female   DOB: 1975/05/30 36 y.o.   MRN: [id number]  HPI: [name] is a 36 y.o. woman who comes today with c/o rash on soles of b/l hands and feet for about 6 months.  She notes dark lesions that are pruritic.  No significant change in size or shape.  She also reports a lesion on the anterior aspect of her right shin, that began as a bump, but is not a flat, dark lesion.  Has not tried anything that makes it better.  Nothing makes it worse.  She thought it was related to fluconazole drug reaction in the distant past, but now is increasingly concerned about melanoma.   Of note, she had a miscarriage in Feb 2013.  During that pregnancy, she developed a rash over her lip on the right side of her face, that has since diminished.     Past Medical History  Diagnosis Date  . Fibroids     uterine  . Anemia   . Obesity   . History of blood transfusion     after d&c  . H/O right breast biopsy     benign  . Chlamydia   . Morbid obesity   . FIBROIDS, UTERUS 06/04/2008    Qualifier: Diagnosis of  By: Polly Cobia MD, [name]    . Genital warts 03/06/2011  . Uterus, adenomyosis 01/17/2011    Suggested by transvaginal US in 09/2010    Current Outpatient Prescriptions  Medication Sig Dispense Refill  . acetaminophen (TYLENOL) 500 MG tablet Take 500 mg by mouth every 6 (six) hours as needed. cramping      . triamcinolone cream (KENALOG) 0.1 % Apply topically 4 (four) times daily as needed.  30 g  0   Family History  Problem Relation Age of Onset  . Hypertension Mother   . Stroke Maternal Aunt   . Hypertension Maternal Grandmother   . Anesthesia problems Neg Hx    History   Social History  . Marital Status: Single    Spouse Name: N/A    Number of Children: N/A  . Years of Education: N/A   Occupational History  . food service    Social History Main Topics  . Smoking status: Never Smoker   . Smokeless tobacco: Never Used  . Alcohol Use: No  .  Drug Use: No  . Sexually Active: Yes    Birth Control/ Protection: None   Other Topics Concern  . None   Social History Narrative   Financial assistance approved for 100% discount at [facility] and has [facility] card per Gavin [place]12/14/2011   Review of Systems: General: no fevers, chills, changes in weight, changes in appetite Skin: no rash HEENT: no blurry vision, hearing changes, sore throat Pulm: no dyspnea, coughing, wheezing CV: no chest pain, palpitations, shortness of breath Abd: no abdominal pain, nausea/vomiting, diarrhea/constipation GU: no dysuria, hematuria, polyuria Ext: no arthralgias, myalgias Neuro: no weakness, numbness, or tingling   Objective:  Physical Exam: Filed Vitals:   11/07/11 1557  BP: 131/81  Pulse: 72  Temp: 97.3 F (36.3 C)  TempSrc: Oral  Weight: 239 lb 9.6 oz (108.682 kg)   Constitutional: Vital signs reviewed.  Patient is a well-developed and well-nourished woman in no acute distress and cooperative with exam.  Mouth: no erythema or exudates, MMM Eyes: PERRL, EOMI, conjunctivae normal, No scleral icterus.   Cardiovascular: RRR, S1 normal, S2 normal, no MRG, pulses symmetric and intact bilaterally Pulmonary/Chest: CTAB,  no wheezes, rales, or rhonchi Abdominal: Soft. Non-tender, non-distended, bowel sounds are normal, no masses, organomegaly, or guarding present.  Musculoskeletal: No joint deformities, erythema, or stiffness, ROM full and no nontender Neurological: A&O x3, Strength is normal and symmetric bilaterally, cranial nerve II-XII are grossly intact, no focal motor deficit, sensory intact to light touch bilaterally.  Skin: 2-85mm hyperpigmented macular lesion - one on right palm, on of left palm (soles) - irregularly shaped; and on planter aspect of right foot; 2-3 cm circumferential hyperpigmented macule on right anterior mid shin Psychiatric: Normal mood and affect. speech and behavior is normal. Judgment and thought content normal. Cognition  and memory are normal.   Assessment & Plan:  Trial of steroid cream.  Return in 2 weeks to monitor.  Case & care discussed with Dr. Rogelia Boga. Please see problem oriented charting for further details.

## 2011-11-21 ENCOUNTER — Encounter: Payer: Self-pay | Admitting: Internal Medicine

## 2011-11-21 ENCOUNTER — Ambulatory Visit (INDEPENDENT_AMBULATORY_CARE_PROVIDER_SITE_OTHER): Payer: Self-pay | Admitting: Internal Medicine

## 2011-11-21 VITALS — BP 127/80 | HR 96 | Temp 98.5°F | Ht 65.0 in | Wt 240.6 lb

## 2011-11-21 DIAGNOSIS — R21 Rash and other nonspecific skin eruption: Secondary | ICD-10-CM

## 2011-11-21 NOTE — Patient Instructions (Addendum)
-  In the coming 2 days,  I will call you regarding how we are going to proceed.  It seems that getting a formal derm will be difficult, but we are working on it.  Please be sure to bring all of your medications with you to every visit.  Should you have any new or worsening symptoms, please be sure to call the clinic at [phone].

## 2011-11-21 NOTE — Assessment & Plan Note (Signed)
No change with Kenalog cream.  Patient was offered shave biopsy.  She was told that the risks of biopsy include bleeding, infection, and scarring.  Risks of not doing the procedure include missing a diagnosis of melanoma/malignancy.  Benefit of the procedure is determining etiology of the lesions and possibly diagnosing cancer.    We recommended biopsy, but patient was hesitant, as she prefers that biopsy be performed by a dermatologist.  She has the orange card, and thus getting a dermatologist appointment will be difficult, as currently there are no volunteers.  Another possibility is asking Dr. Lonzo Cloud to volunteer.  I will call patient in the next 1-2 days with a plan.

## 2011-11-21 NOTE — Progress Notes (Signed)
Subjective:   Patient ID: [name] female   DOB: 06-10-1975 36 y.o.   MRN: [id number]  HPI: Ms.Zariel A Robben is a 36 y.o. woman who comes today to follow up regarding rash on soles of b/l hands and feet for about 6 months.  As reported in previous note:  She notes dark lesions that are pruritic. No significant change in size or shape. She also reports a lesion on the anterior aspect of her right shin, that began as a bump, but is not a flat, dark lesion. Has not tried anything that makes it better. Nothing makes it worse. She thought it was related to fluconazole drug reaction in the distant past, but now is increasingly concerned about melanoma.   Since last visit, she has tried kenalog cream without relief of pruritis and lesion remains the same size.  Of note, lesion on right palm has at least tripled in size since May 2013.  Past Medical History  Diagnosis Date  . Fibroids     uterine  . Anemia   . Obesity   . History of blood transfusion     after d&c  . H/O right breast biopsy     benign  . Chlamydia   . Morbid obesity   . FIBROIDS, UTERUS 06/04/2008    Qualifier: Diagnosis of  By: Polly Cobia MD, [name]    . Genital warts 03/06/2011  . Uterus, adenomyosis 01/17/2011    Suggested by transvaginal US in 09/2010    Current Outpatient Prescriptions  Medication Sig Dispense Refill  . acetaminophen (TYLENOL) 500 MG tablet Take 500 mg by mouth every 6 (six) hours as needed. cramping      . triamcinolone cream (KENALOG) 0.1 % Apply topically 4 (four) times daily as needed.  30 g  0   Family History  Problem Relation Age of Onset  . Hypertension Mother   . Stroke Maternal Aunt   . Hypertension Maternal Grandmother   . Anesthesia problems Neg Hx    History   Social History  . Marital Status: Single    Spouse Name: N/A    Number of Children: N/A  . Years of Education: N/A   Occupational History  . food service    Social History Main Topics  . Smoking status: Never  Smoker   . Smokeless tobacco: Never Used  . Alcohol Use: No  . Drug Use: No  . Sexually Active: Yes    Birth Control/ Protection: None   Other Topics Concern  . None   Social History Narrative   Financial assistance approved for 100% discount at [facility] and has [employer] card per Gavin [place]12/14/2011   Review of Systems: Constitutional: Denies fever, chills, diaphoresis, appetite change and fatigue.  HEENT: Denies photophobia, eye pain, redness, hearing loss, ear pain, congestion, sore throat, rhinorrhea, sneezing, mouth sores, trouble swallowing, neck pain, neck stiffness and tinnitus.   Respiratory: Denies SOB, DOE, cough, chest tightness,  and wheezing.   Cardiovascular: Denies chest pain, palpitations and leg swelling.  Gastrointestinal: Denies nausea, vomiting, abdominal pain, diarrhea, constipation, blood in stool and abdominal distention.  Genitourinary: Denies dysuria, urgency, frequency, hematuria, flank pain and difficulty urinating.  Musculoskeletal: Denies myalgias, back pain, joint swelling, arthralgias and gait problem.  Skin: Denies pallor, rash and wound.  Neurological: Denies dizziness, seizures, syncope, weakness, light-headedness, numbness and headaches.  Psychiatric/Behavioral: Denies suicidal ideation, mood changes, confusion, nervousness, sleep disturbance and agitation  Objective:  Physical Exam: Filed Vitals:   11/21/11 1449  BP: 127/80  Pulse: 96  Temp: 98.5 F (36.9 C)  TempSrc: Oral  Height: 5\' 5"  (1.651 m)  Weight: 240 lb 9.6 oz (109.135 kg)  SpO2: 100%   Constitutional: Vital signs reviewed. Patient is a well-developed and well-nourished woman in no acute distress and cooperative with exam.  Cardiovascular: RRR, S1 normal, S2 normal, no MRG, pulses symmetric and intact bilaterally  Pulmonary/Chest: CTAB, no wheezes, rales, or rhonchi  Abdominal: Soft. Non-tender, non-distended, bowel sounds are normal, no masses, organomegaly, or guarding present.    Musculoskeletal: No joint deformities, erythema, or stiffness, ROM full and no nontender Neurological: A&O x3, Strength is normal and symmetric bilaterally, cranial nerve II-XII are grossly intact, no focal motor deficit, sensory intact to light touch bilaterally.  Skin: 2-66mm hyperpigmented, iregular macular lesion - one on right palm, on of left palm (soles) - irregularly shaped; and on planter aspect of right foot; 2-3 cm circumferential hyperpigmented macule on right anterior mid shin   Assessment & Plan:   Case and care discussed with Dr. Kem Kays. Please see problem oriented charting for further details.

## 2011-11-26 ENCOUNTER — Encounter: Payer: Self-pay | Admitting: Internal Medicine

## 2011-11-28 ENCOUNTER — Ambulatory Visit (INDEPENDENT_AMBULATORY_CARE_PROVIDER_SITE_OTHER): Payer: Self-pay | Admitting: Internal Medicine

## 2011-11-28 ENCOUNTER — Other Ambulatory Visit (HOSPITAL_COMMUNITY)
Admission: RE | Admit: 2011-11-28 | Discharge: 2011-11-28 | Disposition: A | Discharge status: Home / Self Care | Payer: Self-pay | Source: Ambulatory Visit | Attending: Internal Medicine | Admitting: Internal Medicine

## 2011-11-28 ENCOUNTER — Encounter: Payer: Self-pay | Admitting: Internal Medicine

## 2011-11-28 VITALS — BP 114/79 | HR 83 | Temp 97.6°F

## 2011-11-28 DIAGNOSIS — R21 Rash and other nonspecific skin eruption: Secondary | ICD-10-CM | POA: Insufficient documentation

## 2011-11-28 DIAGNOSIS — L259 Unspecified contact dermatitis, unspecified cause: Secondary | ICD-10-CM

## 2011-11-28 NOTE — Progress Notes (Signed)
Subjective:   Patient ID: [name] female   DOB: 1976-02-13 36 y.o.   MRN: [id number]  HPI: [name] is a 36 y.o. woman who comes today with c/o rash on soles of b/l hands and feet for about 6 months. She was most recently seen on 11/07/11 (at which visit we did a trial of kenalog cream without relief) and then again on 11/21/11, during which visit we were going to do a shave biopsy, but she was not comfortable at that time.  She was offered referral to dermatology, but the out of pocket cost would be 200+.  She prefers to have biopsy done in the clinic today.    Regarding rash, below is the note from prior visit, there have been no changes since then:  She notes dark lesions that are pruritic. No significant change in size or shape. She also reports a lesion on the anterior aspect of her right shin, that began as a bump, but is not a flat, dark lesion. Has not tried anything that makes it better. Nothing makes it worse. She thought it was related to fluconazole drug reaction in the distant past, but now is increasingly concerned about melanoma.  Of note, lesion on right palm has at least tripled in size since May 2013.   Past Medical History  Diagnosis Date  . Fibroids     uterine  . Anemia   . Obesity   . History of blood transfusion     after d&c  . H/O right breast biopsy     benign  . Chlamydia   . Morbid obesity   . FIBROIDS, UTERUS 06/04/2008    Qualifier: Diagnosis of  By: Polly Cobia MD, [name]    . Genital warts 03/06/2011  . Uterus, adenomyosis 01/17/2011    Suggested by transvaginal US in 09/2010    Current Outpatient Prescriptions  Medication Sig Dispense Refill  . acetaminophen (TYLENOL) 500 MG tablet Take 500 mg by mouth every 6 (six) hours as needed. cramping      . triamcinolone cream (KENALOG) 0.1 % Apply topically 4 (four) times daily as needed.  30 g  0   Family History  Problem Relation Age of Onset  . Hypertension Mother   . Stroke Maternal Aunt   .  Hypertension Maternal Grandmother   . Anesthesia problems Neg Hx    History   Social History  . Marital Status: Single    Spouse Name: N/A    Number of Children: N/A  . Years of Education: N/A   Occupational History  . food service    Social History Main Topics  . Smoking status: Never Smoker   . Smokeless tobacco: Never Used  . Alcohol Use: No  . Drug Use: No  . Sexually Active: Yes    Birth Control/ Protection: None   Other Topics Concern  . Not on file   Social History Narrative   Financial assistance approved for 100% discount at [facility] and has [facility] card per [place]12/14/2011   Review of Systems: General: no fevers, chills, changes in weight, changes in appetite Skin: no rash HEENT: no blurry vision, hearing changes, sore throat Pulm: no dyspnea, coughing, wheezing CV: no chest pain, palpitations, shortness of breath Abd: no abdominal pain, nausea/vomiting, diarrhea/constipation GU: no dysuria, hematuria, polyuria Ext: no arthralgias, myalgias Neuro: no weakness, numbness, or tingling    Objective:  Physical Exam: Filed Vitals:   11/28/11 1543  BP: 114/79  Pulse: 83  Temp: 97.6  F (36.4 C)  TempSrc: Oral   Constitutional: Vital signs reviewed. Patient is a well-developed and well-nourished woman in no acute distress and cooperative with exam.  Cardiovascular: RRR, S1 normal, S2 normal, no MRG, pulses symmetric and intact bilaterally  Pulmonary/Chest: CTAB, no wheezes, rales, or rhonchi  Abdominal: Soft. Non-tender, non-distended, bowel sounds are normal, no masses, organomegaly, or guarding present.  Musculoskeletal: No joint deformities, erythema, or stiffness, ROM full and no nontender Neurological: A&O x3, Strength is normal and symmetric bilaterally, cranial nerve II-XII are grossly intact, no focal motor deficit, sensory intact to light touch bilaterally.  Skin: 2-78mm hyperpigmented, iregular macular lesion - one on right palm, on of left palm  (soles) - irregularly shaped; and on planter aspect of right foot; 2-3 cm circumferential hyperpigmented macule on right anterior mid shin  Assessment & Plan:   Case and care discussed with Dr. Eben Burow.  Dr. Eben Burow was present during skin shave biopsy.  Please see problem oriented charting for further details.

## 2011-11-28 NOTE — Assessment & Plan Note (Signed)
Shave biopsy done during clinic today.  Dr. Eben Burow supervised.  She was again told the risks of biopsy, including bleeding, infection, and scarring.  Risk of not doing the procedure include missing the diagnosis of melanoma/malignancy. Benefit of the procedure is determining etiology of lesions and possibly diagnosing cancer.    Procedure: Time out done.  We biopsied the lesion on the palm of left hand.  Anesthesia done with topical numbing agent.  Dermablade used for shave biopsy.  Bleeding stopped with pressure.  Wound wrapped with dressing.

## 2012-01-05 ENCOUNTER — Encounter (HOSPITAL_COMMUNITY): Payer: Self-pay | Admitting: Physical Medicine and Rehabilitation

## 2012-01-05 ENCOUNTER — Emergency Department (HOSPITAL_COMMUNITY)
Admission: EM | Admit: 2012-01-05 | Discharge: 2012-01-05 | Disposition: A | Discharge status: Home / Self Care | Payer: Self-pay | Attending: Emergency Medicine | Admitting: Emergency Medicine

## 2012-01-05 DIAGNOSIS — R059 Cough, unspecified: Secondary | ICD-10-CM | POA: Insufficient documentation

## 2012-01-05 DIAGNOSIS — R51 Headache: Secondary | ICD-10-CM | POA: Insufficient documentation

## 2012-01-05 DIAGNOSIS — Z862 Personal history of diseases of the blood and blood-forming organs and certain disorders involving the immune mechanism: Secondary | ICD-10-CM | POA: Insufficient documentation

## 2012-01-05 DIAGNOSIS — R05 Cough: Secondary | ICD-10-CM | POA: Insufficient documentation

## 2012-01-05 DIAGNOSIS — Z8619 Personal history of other infectious and parasitic diseases: Secondary | ICD-10-CM | POA: Insufficient documentation

## 2012-01-05 DIAGNOSIS — Z8742 Personal history of other diseases of the female genital tract: Secondary | ICD-10-CM | POA: Insufficient documentation

## 2012-01-05 DIAGNOSIS — J069 Acute upper respiratory infection, unspecified: Secondary | ICD-10-CM | POA: Insufficient documentation

## 2012-01-05 MED ORDER — LORATADINE-PSEUDOEPHEDRINE ER 5-120 MG PO TB12: 1.0000 | ORAL_TABLET | Freq: Two times a day (BID) | ORAL | Status: DC

## 2012-01-05 NOTE — ED Notes (Signed)
Pt presents to department for evaluation of sinus congestion, headache and pressure sensation to both eyes. Ongoing x3 days. 6/10 pain at the time. Denies fever. She is alert and oriented x4. No signs of acute distress noted.

## 2012-01-05 NOTE — ED Notes (Signed)
Pt c/o nasal congestion and drainage x 3 days that has progressively gotten worse, pt reports she feels like she has a lot of pressure in her forehead from it and reports clear nasal drainage. Pt denies cough.

## 2012-01-05 NOTE — ED Provider Notes (Signed)
History  This chart was scribed for Raeford Razor, MD by Ladona Ridgel Day, ED scribe. This patient was seen in room TR08C/TR08C and the patient's care was started at 1227.   CSN: [id number]  Arrival date & time 01/05/12  1227   None     Chief Complaint  Patient presents with  . Nasal Congestion  . Headache   Patient is a 36 y.o. female presenting with cough. The history is provided by the patient. No language interpreter was used.  Cough This is a new problem. The current episode started more than 2 days ago. The problem occurs constantly. The problem has been gradually worsening. The cough is non-productive. There has been no fever. Associated symptoms include headaches and rhinorrhea. Pertinent negatives include no chills, no ear congestion, no ear pain and no shortness of breath. She has tried nothing for the symptoms.  Katherine Hutchinson is a 36 y.o. female who presents to the Emergency Department complaining of constant gradually worsening sinus congestion, HA, and "eye pressure" for the past 3 days. She states a mild non-productive cough but denies any SOB or fever/chills. She denies ear pain/fullness, visual disturbances.   Past Medical History  Diagnosis Date  . Fibroids     uterine  . Anemia   . Obesity   . History of blood transfusion     after d&c  . H/O right breast biopsy     benign  . Chlamydia   . Morbid obesity   . FIBROIDS, UTERUS 06/04/2008    Qualifier: Diagnosis of  By: Polly Cobia MD, [name]    . Genital warts 03/06/2011  . Uterus, adenomyosis 01/17/2011    Suggested by transvaginal US in 09/2010     Past Surgical History  Procedure Date  . Dilation and curettage, diagnostic / therapeutic   . Breast lumpectomy     benign  . Dilation and evacuation 04/19/2011    Procedure: DILATATION AND EVACUATION;  Surgeon: Lesly Dukes, MD;  Location: [facility];  Service: Gynecology;  Laterality: N/A;    Family History  Problem Relation Age of Onset  . Hypertension Mother   .  Stroke Maternal Aunt   . Hypertension Maternal Grandmother   . Anesthesia problems Neg Hx     History  Substance Use Topics  . Smoking status: Never Smoker   . Smokeless tobacco: Never Used  . Alcohol Use: No    OB History    Grav Para Term Preterm Abortions TAB SAB Ect Mult Living   6 3 3  2 2    3       Review of Systems  Constitutional: Negative for chills.  HENT: Positive for rhinorrhea. Negative for ear pain.   Respiratory: Positive for cough. Negative for shortness of breath.   Neurological: Positive for headaches. Negative for weakness.  All other systems reviewed and are negative.    Allergies  Shellfish-derived products  Home Medications   Current Outpatient Rx  Name  Route  Sig  Dispense  Refill  . IBUPROFEN 200 MG PO TABS   Oral   Take 800 mg by mouth every 6 (six) hours as needed. For pain           Triage Vitals: BP 135/85  Pulse 65  Temp 98.7 F (37.1 C) (Oral)  Resp 20  SpO2 98%  LMP 02/25/2011  Physical Exam  Nursing note and vitals reviewed. Constitutional: She appears well-developed and well-nourished. No distress.  HENT:  Head: Normocephalic and atraumatic.  Tenderness to percussion over her frontal sinuses.   Eyes: Conjunctivae normal are normal. Right eye exhibits no discharge. Left eye exhibits no discharge.  Neck: Neck supple.  Cardiovascular: Normal rate, regular rhythm and normal heart sounds.  Exam reveals no gallop and no friction rub.   No murmur heard. Pulmonary/Chest: Effort normal and breath sounds normal. No respiratory distress.  Abdominal: Soft. She exhibits no distension. There is no tenderness.  Musculoskeletal: She exhibits no edema and no tenderness.  Neurological: She is alert.  Skin: Skin is warm and dry.  Psychiatric: She has a normal mood and affect. Her behavior is normal. Thought content normal.    ED Course  Procedures (including critical care time) DIAGNOSTIC STUDIES: Oxygen Saturation is 98% on  room air, normal by my interpretation.    COORDINATION OF CARE: At 235 PM Discussed treatment plan with patient which includes decongestant medicine. Patient agrees.   Labs Reviewed - No data to display No results found.   1. URI, acute       MDM  36 year old female with a cold. Very low suspicion for serious bacterial illness. Plan symptomatic treatment. Return precautions discussed. Outpatient followup otherwise.  I personally preformed the services scribed in my presence. The recorded information has been reviewed and is accurate. Raeford Razor, MD.          Raeford Razor, MD 01/07/12 1007

## 2012-01-23 ENCOUNTER — Encounter: Payer: Self-pay | Admitting: Internal Medicine

## 2012-02-27 ENCOUNTER — Ambulatory Visit: Payer: Self-pay

## 2012-03-12 ENCOUNTER — Ambulatory Visit: Payer: Self-pay | Admitting: Obstetrics and Gynecology

## 2012-03-19 ENCOUNTER — Encounter: Payer: Self-pay | Admitting: Family Medicine

## 2012-03-19 ENCOUNTER — Ambulatory Visit (INDEPENDENT_AMBULATORY_CARE_PROVIDER_SITE_OTHER): Payer: No Typology Code available for payment source | Admitting: Family Medicine

## 2012-03-19 VITALS — BP 126/87 | HR 85 | Temp 97.6°F | Resp 20 | Ht 65.0 in | Wt 239.6 lb

## 2012-03-19 DIAGNOSIS — N949 Unspecified condition associated with female genital organs and menstrual cycle: Secondary | ICD-10-CM

## 2012-03-19 DIAGNOSIS — A499 Bacterial infection, unspecified: Secondary | ICD-10-CM

## 2012-03-19 DIAGNOSIS — N760 Acute vaginitis: Secondary | ICD-10-CM

## 2012-03-19 DIAGNOSIS — Z01419 Encounter for gynecological examination (general) (routine) without abnormal findings: Secondary | ICD-10-CM

## 2012-03-19 DIAGNOSIS — N898 Other specified noninflammatory disorders of vagina: Secondary | ICD-10-CM

## 2012-03-19 NOTE — Patient Instructions (Signed)
Bacterial Vaginosis Bacterial vaginosis (BV) is a vaginal infection where the normal balance of bacteria in the vagina is disrupted. The normal balance is then replaced by an overgrowth of certain bacteria. There are several different kinds of bacteria that can cause BV. BV is the most common vaginal infection in women of childbearing age. CAUSES   The cause of BV is not fully understood. BV develops when there is an increase or imbalance of harmful bacteria.  Some activities or behaviors can upset the normal balance of bacteria in the vagina and put women at increased risk including:  Having a new sex partner or multiple sex partners.  Douching.  Using an intrauterine device (IUD) for contraception.  It is not clear what role sexual activity plays in the development of BV. However, women that have never had sexual intercourse are rarely infected with BV. Women do not get BV from toilet seats, bedding, swimming pools or from touching objects around them.  SYMPTOMS   Grey vaginal discharge.  A fish-like odor with discharge, especially after sexual intercourse.  Itching or burning of the vagina and vulva.  Burning or pain with urination.  Some women have no signs or symptoms at all. DIAGNOSIS  Your caregiver must examine the vagina for signs of BV. Your caregiver will perform lab tests and look at the sample of vaginal fluid through a microscope. They will look for bacteria and abnormal cells (clue cells), a pH test higher than 4.5, and a positive amine test all associated with BV.  RISKS AND COMPLICATIONS   Pelvic inflammatory disease (PID).  Infections following gynecology surgery.  Developing HIV.  Developing herpes virus. TREATMENT  Sometimes BV will clear up without treatment. However, all women with symptoms of BV should be treated to avoid complications, especially if gynecology surgery is planned. Female partners generally do not need to be treated. However, BV may spread  between female sex partners so treatment is helpful in preventing a recurrence of BV.   BV may be treated with antibiotics. The antibiotics come in either pill or vaginal cream forms. Either can be used with nonpregnant or pregnant women, but the recommended dosages differ. These antibiotics are not harmful to the baby.  BV can recur after treatment. If this happens, a second round of antibiotics will often be prescribed.  Treatment is important for pregnant women. If not treated, BV can cause a premature delivery, especially for a pregnant woman who had a premature birth in the past. All pregnant women who have symptoms of BV should be checked and treated.  For chronic reoccurrence of BV, treatment with a type of prescribed gel vaginally twice a week is helpful. HOME CARE INSTRUCTIONS   Finish all medication as directed by your caregiver.  Do not have sex until treatment is completed.  Tell your sexual partner that you have a vaginal infection. They should see their caregiver and be treated if they have problems, such as a mild rash or itching.  Practice safe sex. Use condoms. Only have 1 sex partner. PREVENTION  Basic prevention steps can help reduce the risk of upsetting the natural balance of bacteria in the vagina and developing BV:  Do not have sexual intercourse (be abstinent).  Do not douche.  Use all of the medicine prescribed for treatment of BV, even if the signs and symptoms go away.  Tell your sex partner if you have BV. That way, they can be treated, if needed, to prevent reoccurrence. SEEK MEDICAL CARE IF:     Your symptoms are not improving after 3 days of treatment.  You have increased discharge, pain, or fever. MAKE SURE YOU:   Understand these instructions.  Will watch your condition.  Will get help right away if you are not doing well or get worse. FOR MORE INFORMATION  Division of STD Prevention (DSTDP), Centers for Disease Control and Prevention:  www.cdc.gov/std American Social Health Association (ASHA): www.ashastd.org  Document Released: 02/05/2005 Document Revised: 04/30/2011 Document Reviewed: 07/29/2008 ExitCare Patient Information 2013 [employer].  

## 2012-03-19 NOTE — Progress Notes (Signed)
Patient ID: Katherine Hutchinson, female   DOB: [birth year], 37 y.o.   MRN: [id number]   S: Patient is here for annual pap. Last pap 03/06/11 was negative.  She states that she was treated for BV in 1/13 and developed a yeast infection after completing course of flagyl so was treated for yeast. She had a miscarriage in Feb 2013 and developed BV again after that. She thinks either the flagyl or the diflucan caused a drug rash on her face and hands. She developed itchy spots on her face and then hyperpigmented spots on her hands. She is still getting worked up for the spots on her hands. She has continued to have episodes of vaginal odor and thinks these are BV. She had some Metrogel left over and has used that to treat the odor on two different occasions. She states this works well but the odor always comes back. This seems to happen after her period. Just finished her period and last week she had vaginal odor. She used Metrogel for 2 days until she ran out. No odor or discharge currently. Patient does not take long baths or douche.    Menstrual history:  Periods last 5 to 6 days, 3 days are heavy (4 pads a day). Cycles are regular. She does have cramping on the first days relieved by NSAIDs.   LMP 03/03/12  Pap Smears:   03/06/11 negative 06/24/09 negative 08/25/08 negative Hx abnormal pap in her teens, no further work-up. Genital warts at age 42.  Birth control:  None   Past Medical History  Diagnosis Date  . Fibroids     uterine  . Anemia   . Obesity   . History of blood transfusion     after d&c  . H/O right breast biopsy     benign  . Chlamydia   . Morbid obesity   . FIBROIDS, UTERUS 06/04/2008    Qualifier: Diagnosis of  By: Polly Cobia MD, [name]    . Genital warts 03/06/2011  . Uterus, adenomyosis 01/17/2011    Suggested by transvaginal US in 09/2010    Past Surgical History  Procedure Date  . Dilation and curettage, diagnostic / therapeutic   . Breast lumpectomy     benign  . Dilation and  evacuation 04/19/2011    Procedure: DILATATION AND EVACUATION;  Surgeon: Lesly Dukes, MD;  Location: [facility];  Service: Gynecology;  Laterality: N/A;   Social:  No tobacco, occasional alcohol, no illicit drugs. Family History  Problem Relation Age of Onset  . Hypertension Mother   . Stroke Maternal Aunt   . Hypertension Maternal Grandmother   . Anesthesia problems Neg Hx   . Hypertension Sister   . Heart disease Brother     PCP:  [facility] internal medicine resident clinic  OBJECTIVE: Filed Vitals:   03/19/12 1333  BP: 126/87  Pulse: 85  Temp: 97.6 F (36.4 C)  Resp: 20    PHYSICAL EXAM GEN:  Wnwd, no distress, appears stated age HEENT:  NCAT, EOMI, conjunctiva clear NECK:  Supple, trachea midline, no thyroid enlargement, masses or adenopathy CV:  RRR,  No murmurs RESP:  CTAB ABD:  Soft, non-tender, normal BS GU:  Normal external genitalia and vagina, normal cervix, minimal physiologic vaginal discharge, no odor. No CMT, no adnexal masses or tenderness. Bimanual exam limited by body habitus. EXTREM:  No edema or tenderness. SKIN:  Few 0.5 cm hyperpigmented macules on palms both hands  A/P 37 y.o. female here for annual exam -  No pap indicated this year, 3 normal paps over last 4 years - Recurrent BV - wet prep sent today. Discussed long-term treatment with metrogel. Pt inquired about Rephresh advertised on TV. Discussed possible theoretical benefits of probiotics. Pt would like to be treated with PO flagyl if positive for BV. Will try Rephresh. Suggested using after menstrual cycle as this seems to be when she has most problems. Does not like using vaginal gel.  - F/U with PCP for skin rash. - Return in one year for annual pelvic exam.  Napoleon Form, MD

## 2012-04-06 ENCOUNTER — Encounter (HOSPITAL_COMMUNITY): Payer: Self-pay

## 2012-04-06 ENCOUNTER — Emergency Department (HOSPITAL_COMMUNITY)
Admission: EM | Admit: 2012-04-06 | Discharge: 2012-04-06 | Disposition: A | Discharge status: Home / Self Care | Payer: No Typology Code available for payment source | Attending: Emergency Medicine | Admitting: Emergency Medicine

## 2012-04-06 DIAGNOSIS — Z8742 Personal history of other diseases of the female genital tract: Secondary | ICD-10-CM | POA: Insufficient documentation

## 2012-04-06 DIAGNOSIS — R32 Unspecified urinary incontinence: Secondary | ICD-10-CM | POA: Insufficient documentation

## 2012-04-06 DIAGNOSIS — R059 Cough, unspecified: Secondary | ICD-10-CM | POA: Insufficient documentation

## 2012-04-06 DIAGNOSIS — Z862 Personal history of diseases of the blood and blood-forming organs and certain disorders involving the immune mechanism: Secondary | ICD-10-CM | POA: Insufficient documentation

## 2012-04-06 DIAGNOSIS — Z8619 Personal history of other infectious and parasitic diseases: Secondary | ICD-10-CM | POA: Insufficient documentation

## 2012-04-06 DIAGNOSIS — R05 Cough: Secondary | ICD-10-CM | POA: Insufficient documentation

## 2012-04-06 DIAGNOSIS — J3489 Other specified disorders of nose and nasal sinuses: Secondary | ICD-10-CM | POA: Insufficient documentation

## 2012-04-06 DIAGNOSIS — J029 Acute pharyngitis, unspecified: Secondary | ICD-10-CM | POA: Insufficient documentation

## 2012-04-06 MED ORDER — PREDNISONE 20 MG PO TABS: 40.0000 mg | ORAL_TABLET | Freq: Every day | ORAL | Status: DC

## 2012-04-06 MED ORDER — ALBUTEROL SULFATE HFA 108 (90 BASE) MCG/ACT IN AERS
2.0000 | INHALATION_SPRAY | RESPIRATORY_TRACT | Status: DC | PRN
  Administered 2012-04-06: 2 via RESPIRATORY_TRACT
  Filled 2012-04-06: qty 6.7

## 2012-04-06 MED ORDER — AEROCHAMBER Z-STAT PLUS/MEDIUM MISC
1.0000 | Freq: Once | Status: AC
  Administered 2012-04-06: 1

## 2012-04-06 NOTE — ED Provider Notes (Signed)
Medical screening examination/treatment/procedure(s) were performed by non-physician practitioner and as supervising physician I was immediately available for consultation/collaboration.   Erikah Thumm, MD 04/06/12 2328 

## 2012-04-06 NOTE — ED Provider Notes (Signed)
History  This chart was scribed for Rhea Bleacher, non-physician practitioner working with Dione Booze, MD by Bennett Scrape, ED Scribe. This patient was seen in room TR10C/TR10C and the patient's care was started at 8:25 PM.  CSN: [id number]  Arrival date & time 04/06/12  1946   First MD Initiated Contact with Patient 04/06/12 2025      Chief Complaint  Patient presents with  . Cough  . Sore Throat    The history is provided by the patient. No language interpreter was used.    Katherine Hutchinson is a 37 y.o. female who presents to the Emergency Department complaining of one month of gradual onset, non-changing, constant non-productive cough with mild urinary incontinence during coughing spells and mild sore throat also with coughing. The cough is worse in the morning and when laying down at night. She states that she had rhinorrhea and congestion which have since resolved but the cough continues. She reports taking robitussin and Nyquil with no improvement. She reports several sick contacts, both at home and at work. She denies having a h/o GERD and she denies smoking. She denies diarrhea, urinary symptoms, nausea, emesis and fever as associated symptoms. She does not have a h/o chronic medical conditions and is an occasional alcohol user.   Past Medical History  Diagnosis Date  . Fibroids     uterine  . Anemia   . Obesity   . History of blood transfusion     after d&c  . H/O right breast biopsy     benign  . Chlamydia   . Morbid obesity   . FIBROIDS, UTERUS 06/04/2008    Qualifier: Diagnosis of  By: Polly Cobia MD, [name]    . Genital warts 03/06/2011  . Uterus, adenomyosis 01/17/2011    Suggested by transvaginal US in 09/2010     Past Surgical History  Procedure Laterality Date  . Dilation and curettage, diagnostic / therapeutic    . Breast lumpectomy      benign  . Dilation and evacuation  04/19/2011    Procedure: DILATATION AND EVACUATION;  Surgeon: Lesly Dukes, MD;   Location: [facility];  Service: Gynecology;  Laterality: N/A;    Family History  Problem Relation Age of Onset  . Hypertension Mother   . Stroke Maternal Aunt   . Hypertension Maternal Grandmother   . Anesthesia problems Neg Hx   . Hypertension Sister   . Heart disease Brother     History  Substance Use Topics  . Smoking status: Never Smoker   . Smokeless tobacco: Never Used  . Alcohol Use: Yes     Comment: occasionally    OB History   Grav Para Term Preterm Abortions TAB SAB Ect Mult Living   6 3 3  2 2    3       Review of Systems  Constitutional: Negative for fever.  HENT: Positive for sore throat. Negative for congestion.   Respiratory: Positive for cough. Negative for shortness of breath.   Gastrointestinal: Negative for nausea and vomiting.  Genitourinary: Negative for dysuria and urgency.       Mild urinary incontinence during coughing spells  All other systems reviewed and are negative.    Allergies  Shellfish-derived products  Home Medications   Current Outpatient Rx  Name  Route  Sig  Dispense  Refill  . Pseudoephedrine-APAP-DM (DAYQUIL PO)   Oral   Take 1 capsule by mouth daily as needed. For cold symptoms  Triage Vitals: BP 143/86  Pulse 67  Temp(Src) 98.6 F (37 C) (Oral)  Resp 16  SpO2 98%  LMP 03/31/2012  Physical Exam  Nursing note and vitals reviewed. Constitutional: She appears well-developed and well-nourished. No distress.  HENT:  Head: Normocephalic and atraumatic.  Mouth/Throat: Oropharynx is clear and moist. No oropharyngeal exudate.  TMs are clear bilaterally  Eyes: Conjunctivae and EOM are normal. Pupils are equal, round, and reactive to light.  Neck: Normal range of motion. Neck supple. No tracheal deviation present.  Cardiovascular: Normal rate and regular rhythm.   No murmur heard. Pulmonary/Chest: Effort normal and breath sounds normal. No respiratory distress. She has no wheezes.  Musculoskeletal: Normal range  of motion.  Neurological: She is alert.  Skin: Skin is warm and dry.  Psychiatric: She has a normal mood and affect. Her behavior is normal.    ED Course  Procedures (including critical care time)  DIAGNOSTIC STUDIES: Oxygen Saturation is 98% on room air, normal by my interpretation.    COORDINATION OF CARE: 8:41 PM-Discussed discharge plan which includes albuterol inhaler every 4 hours especially before bed and prednisone which should decreased the inflammation with pt at bedside and pt agreed to plan.   Labs Reviewed  RAPID STREP SCREEN   No results found.   1. Cough     Vital signs reviewed and are as follows: Filed Vitals:   04/06/12 1956  BP: 143/86  Pulse: 67  Temp: 98.6 F (37 C)  Resp: 16   Patient counseled on use of albuterol HFA.  Told to use 1-2 puffs q 4 hours as needed for SOB.  Patient counseled on supportive care for viral URI and s/s to return including worsening symptoms, persistent fever, persistent vomiting, or if they have any other concerns.  Urged to see PCP if symptoms persist for more than 3 days. Patient verbalizes understanding and agrees with plan.    MDM  Patient with symptoms consistent with cough after previous viral syndrome.  Vitals are stable, no fever.  No signs of dehydration.  Supportive therapy indicated with return if symptoms worsen.  Will give albuterol and steroid trial due to duration of symptoms.       I personally performed the services described in this documentation, which was scribed in my presence. The recorded information has been reviewed and is accurate.    Renne Crigler, [place] 04/06/12 2228

## 2012-04-06 NOTE — ED Notes (Signed)
Patient presents with c/o cough x 1 month and sore throat x 4 days. Have been using OTC medications and home remedies with no relief in sx. Denies CP, SOB, H/A or nasal congestion.

## 2012-07-30 ENCOUNTER — Ambulatory Visit: Payer: No Typology Code available for payment source | Admitting: Advanced Practice Midwife

## 2012-07-30 ENCOUNTER — Ambulatory Visit: Payer: No Typology Code available for payment source | Admitting: Obstetrics & Gynecology

## 2012-09-10 ENCOUNTER — Emergency Department (HOSPITAL_COMMUNITY): Payer: Self-pay

## 2012-09-10 ENCOUNTER — Encounter (HOSPITAL_COMMUNITY): Payer: Self-pay | Admitting: Emergency Medicine

## 2012-09-10 DIAGNOSIS — Z862 Personal history of diseases of the blood and blood-forming organs and certain disorders involving the immune mechanism: Secondary | ICD-10-CM | POA: Insufficient documentation

## 2012-09-10 DIAGNOSIS — Z8619 Personal history of other infectious and parasitic diseases: Secondary | ICD-10-CM | POA: Insufficient documentation

## 2012-09-10 DIAGNOSIS — Z8742 Personal history of other diseases of the female genital tract: Secondary | ICD-10-CM | POA: Insufficient documentation

## 2012-09-10 DIAGNOSIS — M25519 Pain in unspecified shoulder: Secondary | ICD-10-CM | POA: Insufficient documentation

## 2012-09-10 DIAGNOSIS — Z872 Personal history of diseases of the skin and subcutaneous tissue: Secondary | ICD-10-CM | POA: Insufficient documentation

## 2012-09-10 DIAGNOSIS — R51 Headache: Secondary | ICD-10-CM | POA: Insufficient documentation

## 2012-09-10 DIAGNOSIS — R0602 Shortness of breath: Secondary | ICD-10-CM | POA: Insufficient documentation

## 2012-09-10 DIAGNOSIS — R509 Fever, unspecified: Secondary | ICD-10-CM | POA: Insufficient documentation

## 2012-09-10 NOTE — ED Notes (Signed)
PT. REPORTS SOB WITH LEFT SHOULDER PAIN , " SWEATS" AND EMESIS ONSET LAST NIGHT .

## 2012-09-11 ENCOUNTER — Emergency Department (HOSPITAL_COMMUNITY)
Admission: EM | Admit: 2012-09-11 | Discharge: 2012-09-11 | Disposition: A | Discharge status: Home / Self Care | Payer: Self-pay | Attending: Emergency Medicine | Admitting: Emergency Medicine

## 2012-09-11 DIAGNOSIS — R0602 Shortness of breath: Secondary | ICD-10-CM

## 2012-09-11 LAB — CBC WITH DIFFERENTIAL/PLATELET
Eosinophils Relative: 4 % (ref 0–5)
HCT: 30.7 % — ABNORMAL LOW (ref 36.0–46.0)
Hemoglobin: 9.5 g/dL — ABNORMAL LOW (ref 12.0–15.0)
Lymphocytes Relative: 31 % (ref 12–46)
Lymphs Abs: 2.5 10*3/uL (ref 0.7–4.0)
MCV: 74.9 fL — ABNORMAL LOW (ref 78.0–100.0)
Monocytes Relative: 8 % (ref 3–12)
Neutro Abs: 4.5 10*3/uL (ref 1.7–7.7)
RBC: 4.1 MIL/uL (ref 3.87–5.11)
WBC: 7.9 10*3/uL (ref 4.0–10.5)

## 2012-09-11 LAB — COMPREHENSIVE METABOLIC PANEL
AST: 17 U/L (ref 0–37)
Alkaline Phosphatase: 55 U/L (ref 39–117)
BUN: 10 mg/dL (ref 6–23)
CO2: 28 mEq/L (ref 19–32)
Chloride: 103 mEq/L (ref 96–112)
Creatinine, Ser: 0.81 mg/dL (ref 0.50–1.10)
GFR calc non Af Amer: >90 mL/min (ref >90)
Potassium: 3.5 mEq/L (ref 3.5–5.1)
Total Bilirubin: 0.2 mg/dL — ABNORMAL LOW (ref 0.3–1.2)

## 2012-09-11 MED ORDER — ALPRAZOLAM 0.5 MG PO TABS: 0.5000 mg | ORAL_TABLET | Freq: Three times a day (TID) | ORAL | Status: DC | PRN

## 2012-09-11 NOTE — ED Notes (Signed)
The pt has multiple symptoms sob none hot flashes waking up at night with sweats.  Vomiting intermittently headache also no temp.  lmp July 3rd.  No cough etc  She has all the symptoms for several days

## 2012-09-11 NOTE — ED Provider Notes (Signed)
History    CSN: [id number] Arrival date & time 09/10/12  2255  First MD Initiated Contact with Patient 09/11/12 0044     Chief Complaint  Patient presents with  . Shortness of Breath   (Consider location/radiation/quality/duration/timing/severity/associated sxs/prior Treatment) HPI  Katherine Hutchinson is a 37 y.o. female complaining of hot flashes, fever/chills/SOB, atraumatic left shoulder pain, HA, single peisode of NBNBemesis last night, now tolerating PO. symptom onset ~2 days ago. Pt states pain can get 10/10 severity, no aggravating or alleviating factors. No pain Rx PTA. Denies recenet travel/surgery/immboiliation/calf swelling/ h/oDVT or PE  IM dr Deliah Goody   Past Medical History  Diagnosis Date  . Fibroids     uterine  . Anemia   . Obesity   . History of blood transfusion     after d&c  . H/O right breast biopsy     benign  . Chlamydia   . Morbid obesity   . FIBROIDS, UTERUS 06/04/2008    Qualifier: Diagnosis of  By: Polly Cobia MD, [name]    . Genital warts 03/06/2011  . Uterus, adenomyosis 01/17/2011    Suggested by transvaginal US in 09/2010    Past Surgical History  Procedure Laterality Date  . Dilation and curettage, diagnostic / therapeutic    . Breast lumpectomy      benign  . Dilation and evacuation  04/19/2011    Procedure: DILATATION AND EVACUATION;  Surgeon: Lesly Dukes, MD;  Location: [facility];  Service: Gynecology;  Laterality: N/A;   Family History  Problem Relation Age of Onset  . Hypertension Mother   . Stroke Maternal Aunt   . Hypertension Maternal Grandmother   . Anesthesia problems Neg Hx   . Hypertension Sister   . Heart disease Brother    History  Substance Use Topics  . Smoking status: Never Smoker   . Smokeless tobacco: Never Used  . Alcohol Use: Yes     Comment: occasionally   OB History   Grav Para Term Preterm Abortions TAB SAB Ect Mult Living   6 3 3  2 2    3      Review of Systems 10 systems reviewed and found to be negative,  except as noted in the HPI  Allergies  Shellfish-derived products  Home Medications  No current outpatient prescriptions on file. BP 130/82  Pulse 98  Temp(Src) 98.2 F (36.8 C) (Oral)  Resp 18  SpO2 98%  LMP 08/21/2012 Physical Exam  Nursing note and vitals reviewed. Constitutional: She is oriented to person, place, and time. She appears well-developed and well-nourished. No distress.  HENT:  Head: Normocephalic.  Mouth/Throat: Oropharynx is clear and moist.  Eyes: Conjunctivae and EOM are normal. Pupils are equal, round, and reactive to light.  Neck: Normal range of motion. Neck supple.  Cardiovascular: Normal rate, regular rhythm and intact distal pulses.   Pulmonary/Chest: Effort normal and breath sounds normal. No stridor. No respiratory distress. She has no wheezes. She has no rales. She exhibits no tenderness.  Abdominal: Soft. Bowel sounds are normal. She exhibits no distension and no mass. There is no tenderness. There is no rebound and no guarding.  Musculoskeletal: Normal range of motion. She exhibits no edema.  No calf asymmetry, superficial collaterals, palpable cords, edema, Homans sign negative bilaterally.    Neurological: She is alert and oriented to person, place, and time.  Psychiatric:  Mild agitation    ED Course  Procedures (including critical care time) Labs Reviewed  CBC WITH DIFFERENTIAL -  Abnormal; Notable for the following:    Hemoglobin 9.5 (*)    HCT 30.7 (*)    MCV 74.9 (*)    MCH 23.2 (*)    RDW 16.0 (*)    All other components within normal limits  COMPREHENSIVE METABOLIC PANEL - Abnormal; Notable for the following:    Albumin 3.4 (*)    Total Bilirubin 0.2 (*)    All other components within normal limits   Dg Chest 2 View  09/10/2012   *RADIOLOGY REPORT*  Clinical Data: Shortness of breath.  CHEST - 2 VIEW  Comparison: 08/21/2010  Findings: The heart size and pulmonary vascularity are normal. The lungs appear clear and expanded without  focal air space disease or consolidation. No blunting of the costophrenic angles.  No pneumothorax.  Mediastinal contours appear intact.  No significant change since previous study.  IMPRESSION: No evidence of active pulmonary disease.   Original Report Authenticated By: Burman Nieves, M.D.    Date: 09/13/2012  Rate: 76  Rhythm: normal sinus rhythm  QRS Axis: normal  Intervals: normal  ST/T Wave abnormalities: normal  Conduction Disutrbances: none  Narrative Interpretation:   Old EKG Reviewed: none  1. SOB (shortness of breath)     MDM   Filed Vitals:   09/10/12 2304 09/11/12 0236  BP: 130/82 135/77  Pulse: 98   Temp: 98.2 F (36.8 C) 98.6 F (37 C)  TempSrc: Oral   Resp: 18 18  SpO2: 98% 98%     Katherine Hutchinson is a 37 y.o. female with multiple complaints including SOB. Pt is low risk by Wells and PERC negative. Symptoms improved with reassurance. Likely panic d/o.   Pt is hemodynamically stable, appropriate for, and amenable to discharge at this time. Pt verbalized understanding and agrees with care plan. All questions answered. Outpatient follow-up and specific return precautions discussed.    Discharge Medication List as of 09/11/2012  2:16 AM    START taking these medications   Details  ALPRAZolam (XANAX) 0.5 MG tablet Take 1 tablet (0.5 mg total) by mouth 3 (three) times daily as needed for anxiety., Starting 09/11/2012, Until Discontinued, Print        Note: Portions of this report may have been transcribed using voice recognition software. Every effort was made to ensure accuracy; however, inadvertent computerized transcription errors may be present    Wynetta Emery, PA-C 09/13/12 1610

## 2012-09-13 NOTE — ED Provider Notes (Signed)
Medical screening examination/treatment/procedure(s) were conducted as a shared visit with non-physician practitioner(s) and myself.  I personally evaluated the patient during the encounter   Hanley Seamen, MD 09/13/12 2239

## 2012-09-16 ENCOUNTER — Encounter (HOSPITAL_COMMUNITY): Payer: Self-pay

## 2012-09-16 ENCOUNTER — Emergency Department (HOSPITAL_COMMUNITY)
Admission: EM | Admit: 2012-09-16 | Discharge: 2012-09-16 | Disposition: A | Discharge status: Home / Self Care | Payer: Self-pay | Attending: Emergency Medicine | Admitting: Emergency Medicine

## 2012-09-16 DIAGNOSIS — Z9889 Other specified postprocedural states: Secondary | ICD-10-CM | POA: Insufficient documentation

## 2012-09-16 DIAGNOSIS — R209 Unspecified disturbances of skin sensation: Secondary | ICD-10-CM | POA: Insufficient documentation

## 2012-09-16 DIAGNOSIS — Z8542 Personal history of malignant neoplasm of other parts of uterus: Secondary | ICD-10-CM | POA: Insufficient documentation

## 2012-09-16 DIAGNOSIS — R0602 Shortness of breath: Secondary | ICD-10-CM | POA: Insufficient documentation

## 2012-09-16 DIAGNOSIS — R110 Nausea: Secondary | ICD-10-CM | POA: Insufficient documentation

## 2012-09-16 DIAGNOSIS — Z79899 Other long term (current) drug therapy: Secondary | ICD-10-CM | POA: Insufficient documentation

## 2012-09-16 DIAGNOSIS — M62838 Other muscle spasm: Secondary | ICD-10-CM | POA: Insufficient documentation

## 2012-09-16 DIAGNOSIS — IMO0002 Reserved for concepts with insufficient information to code with codable children: Secondary | ICD-10-CM | POA: Insufficient documentation

## 2012-09-16 DIAGNOSIS — Z8619 Personal history of other infectious and parasitic diseases: Secondary | ICD-10-CM | POA: Insufficient documentation

## 2012-09-16 DIAGNOSIS — Z862 Personal history of diseases of the blood and blood-forming organs and certain disorders involving the immune mechanism: Secondary | ICD-10-CM | POA: Insufficient documentation

## 2012-09-16 DIAGNOSIS — Z9189 Other specified personal risk factors, not elsewhere classified: Secondary | ICD-10-CM | POA: Insufficient documentation

## 2012-09-16 DIAGNOSIS — K219 Gastro-esophageal reflux disease without esophagitis: Secondary | ICD-10-CM | POA: Insufficient documentation

## 2012-09-16 DIAGNOSIS — E669 Obesity, unspecified: Secondary | ICD-10-CM | POA: Insufficient documentation

## 2012-09-16 DIAGNOSIS — Z8742 Personal history of other diseases of the female genital tract: Secondary | ICD-10-CM | POA: Insufficient documentation

## 2012-09-16 MED ORDER — CYCLOBENZAPRINE HCL 10 MG PO TABS: 10.0000 mg | ORAL_TABLET | Freq: Two times a day (BID) | ORAL | Status: DC | PRN

## 2012-09-16 MED ORDER — PROMETHAZINE HCL 25 MG PO TABS: 25.0000 mg | ORAL_TABLET | Freq: Four times a day (QID) | ORAL | Status: DC | PRN

## 2012-09-16 NOTE — ED Provider Notes (Signed)
Medical screening examination/treatment/procedure(s) were performed by non-physician practitioner and as supervising physician I was immediately available for consultation/collaboration.   Cambrea Kirt, MD 09/16/12 2303 

## 2012-09-16 NOTE — ED Provider Notes (Signed)
CSN: [id number]     Arrival date & time 09/16/12  1916 History     First MD Initiated Contact with Patient 09/16/12 2014     Chief Complaint  Patient presents with  . Shortness of Breath   (Consider location/radiation/quality/duration/timing/severity/associated sxs/prior Treatment) HPI  Katherine Hutchinson is a 37 y.o. female complaining of shortness of, left shoulder tension in pain, nausea with no vomiting today, acid reflux, facial paresthesia and agitation. Patient states that the symptoms have been waxing and waning over the course of several months. She has been taking Nexium with good relief for the reflux. Patient denies fever, chest pain, emesis in the last 24 hours, the tension and pain in the shoulder is relieved when her boyfriend massages it. She denies any history of DVT, PE, recent travel, recent immobilization, calf pain or swelling.  Past Medical History  Diagnosis Date  . Fibroids     uterine  . Anemia   . Obesity   . History of blood transfusion     after d&c  . H/O right breast biopsy     benign  . Chlamydia   . Morbid obesity   . FIBROIDS, UTERUS 06/04/2008    Qualifier: Diagnosis of  By: Polly Cobia MD, [name]    . Genital warts 03/06/2011  . Uterus, adenomyosis 01/17/2011    Suggested by transvaginal US in 09/2010    Past Surgical History  Procedure Laterality Date  . Dilation and curettage, diagnostic / therapeutic    . Breast lumpectomy      benign  . Dilation and evacuation  04/19/2011    Procedure: DILATATION AND EVACUATION;  Surgeon: Lesly Dukes, MD;  Location: [facility];  Service: Gynecology;  Laterality: N/A;   Family History  Problem Relation Age of Onset  . Hypertension Mother   . Stroke Maternal Aunt   . Hypertension Maternal Grandmother   . Anesthesia problems Neg Hx   . Hypertension Sister   . Heart disease Brother    History  Substance Use Topics  . Smoking status: Never Smoker   . Smokeless tobacco: Never Used  . Alcohol Use: Yes      Comment: occasionally   OB History   Grav Para Term Preterm Abortions TAB SAB Ect Mult Living   6 3 3  2 2    3      Review of Systems 10 systems reviewed and found to be negative, except as noted in the HPI  Allergies  Shellfish-derived products  Home Medications   Current Outpatient Rx  Name  Route  Sig  Dispense  Refill  . ALPRAZolam (XANAX) 0.5 MG tablet   Oral   Take 1 tablet (0.5 mg total) by mouth 3 (three) times daily as needed for anxiety.   5 tablet   0   . ibuprofen (ADVIL,MOTRIN) 200 MG tablet   Oral   Take 800 mg by mouth every 8 (eight) hours as needed for pain.         . cyclobenzaprine (FLEXERIL) 10 MG tablet   Oral   Take 1 tablet (10 mg total) by mouth 2 (two) times daily as needed for muscle spasms.   20 tablet   0   . promethazine (PHENERGAN) 25 MG tablet   Oral   Take 1 tablet (25 mg total) by mouth every 6 (six) hours as needed for nausea.   12 tablet   0    BP 116/64  Pulse 68  Temp(Src) 98.7 F (37.1 C) (Oral)  Resp 16  Ht 5\' 5"  (1.651 m)  Wt 233 lb (105.688 kg)  BMI 38.77 kg/m2  SpO2 100%  LMP 09/16/2012 Physical Exam  Nursing note and vitals reviewed. Constitutional: She is oriented to person, place, and time. She appears well-developed and well-nourished. No distress.  HENT:  Head: Normocephalic.  Mouth/Throat: Oropharynx is clear and moist.  Eyes: Conjunctivae and EOM are normal. Pupils are equal, round, and reactive to light.  Neck: Normal range of motion.  Cardiovascular: Normal rate.   Pulmonary/Chest: Effort normal and breath sounds normal. No stridor. No respiratory distress. She has no wheezes. She has no rales. She exhibits no tenderness.  Abdominal: Soft. Bowel sounds are normal. She exhibits no distension and no mass. There is no tenderness. There is no rebound and no guarding.  Musculoskeletal: Normal range of motion. She exhibits no edema.  No calf asymmetry, superficial collaterals, palpable cords, edema, Homans  sign negative bilaterally.   Lymphadenopathy:    She has no cervical adenopathy.  Neurological: She is alert and oriented to person, place, and time.  Psychiatric: She has a normal mood and affect.    ED Course   Procedures (including critical care time)  Labs Reviewed - No data to display No results found. 1. SOB (shortness of breath)   2. Muscle spasm   3. Nausea alone     MDM   Filed Vitals:   09/16/12 1928  BP: 116/64  Pulse: 68  Temp: 98.7 F (37.1 C)  TempSrc: Oral  Resp: 16  Height: 5\' 5"  (1.651 m)  Weight: 233 lb (105.688 kg)  SpO2: 100%     Katherine Hutchinson is a 37 y.o. female patient with multiple complaints, seen for similar approximately one week ago. I have advised the patient that it is very important for her to follow with primary care. I have reassured her that she does not have a life threat and that these concerns would be better addressed in an outpatient setting. She has not made an appointment with her primary care doctor as of yet I have advised her to call them first thing in the morning. She has read that as she muscle spasms can cause both nausea and shortness of breath and isrequesting a muscle relaxer.  Pt is hemodynamically stable, appropriate for, and amenable to discharge at this time. Pt verbalized understanding and agrees with care plan. All questions answered. Outpatient follow-up and specific return precautions discussed.    New Prescriptions   CYCLOBENZAPRINE (FLEXERIL) 10 MG TABLET    Take 1 tablet (10 mg total) by mouth 2 (two) times daily as needed for muscle spasms.   PROMETHAZINE (PHENERGAN) 25 MG TABLET    Take 1 tablet (25 mg total) by mouth every 6 (six) hours as needed for nausea.    Note: Portions of this report may have been transcribed using voice recognition software. Every effort was made to ensure accuracy; however, inadvertent computerized transcription errors may be present    Wynetta Emery, PA-C 09/16/12 2032

## 2012-09-16 NOTE — ED Notes (Signed)
Pt reports SOB that has continued since recent visit at [facility]. Pt able to speak in full sentences. No distress noted. Pt reports shoulder pain to the L side that is tense.

## 2012-09-18 ENCOUNTER — Ambulatory Visit (INDEPENDENT_AMBULATORY_CARE_PROVIDER_SITE_OTHER): Payer: Self-pay | Admitting: Internal Medicine

## 2012-09-18 ENCOUNTER — Telehealth: Payer: Self-pay | Admitting: *Deleted

## 2012-09-18 ENCOUNTER — Encounter: Payer: Self-pay | Admitting: Internal Medicine

## 2012-09-18 VITALS — BP 117/73 | HR 67 | Temp 97.0°F | Wt 237.8 lb

## 2012-09-18 DIAGNOSIS — R0602 Shortness of breath: Secondary | ICD-10-CM

## 2012-09-18 DIAGNOSIS — D509 Iron deficiency anemia, unspecified: Secondary | ICD-10-CM

## 2012-09-18 DIAGNOSIS — M549 Dorsalgia, unspecified: Secondary | ICD-10-CM

## 2012-09-18 DIAGNOSIS — G8929 Other chronic pain: Secondary | ICD-10-CM

## 2012-09-18 DIAGNOSIS — M25512 Pain in left shoulder: Secondary | ICD-10-CM

## 2012-09-18 DIAGNOSIS — K219 Gastro-esophageal reflux disease without esophagitis: Secondary | ICD-10-CM

## 2012-09-18 DIAGNOSIS — M25519 Pain in unspecified shoulder: Secondary | ICD-10-CM

## 2012-09-18 NOTE — Patient Instructions (Addendum)
I will call you with results of your lab work for anemia, we will likely start iron  I have referred you to a chiropractor for the neck/shoulder pain  Consider going to [facility] for anxiety counseling.   F/u in 4 weeks with PCP

## 2012-09-18 NOTE — Telephone Encounter (Signed)
Pt calls and states she is having episodes of shortness of breath, pain/ tension from neck down L shoulder and episodes of heavy diaphoresis. States she has been fine since yesterday but she is very worried about this and wants to be seen today, states she has been to ED twice and was told she is fine, possible panic attacks but she wants to know for sure. Scheduled for 1430 dr Glendell Docker but told pt to be in clinic between 1400 and 1415, she is agreeable and understands it might be 1430 before she is seen. She is ask to call 911 or go straight to ED if she has another episode before appt, she is agreeable

## 2012-09-18 NOTE — Assessment & Plan Note (Addendum)
CBC    Component Value Date/Time   WBC 7.9 09/10/2012 2303   RBC 4.10 09/10/2012 2303   HGB 9.5* 09/10/2012 2303   HCT 30.7* 09/10/2012 2303   PLT 270 09/10/2012 2303   MCV 74.9* 09/10/2012 2303   MCH 23.2* 09/10/2012 2303   MCHC 30.9 09/10/2012 2303   RDW 16.0* 09/10/2012 2303   LYMPHSABS 2.5 09/10/2012 2303   MONOABS 0.6 09/10/2012 2303   EOSABS 0.3 09/10/2012 2303   BASOSABS 0.0 09/10/2012 2303    Checking anemia panel today. Will likely start iron as indicated.

## 2012-09-18 NOTE — Telephone Encounter (Signed)
Agree with ED appt 

## 2012-09-18 NOTE — Assessment & Plan Note (Signed)
At the end of the visit, the patient stated that she had been having some burning after eating, a bad taste in her mouth, and describe regurgitating small amounts of food into her mouth while eating. She states that she tried nexium, and had some relief. As it was at the end of this visit, I recommended that we wait until her next visit to talk more about these symptoms until next time. I told her it would be fine to use OTC medicine until then. She agreed with the plan.

## 2012-09-18 NOTE — Assessment & Plan Note (Signed)
A:  The patient has undergone appropriate SOB workup in the ED twice in the last week without identifying a source. The SOB is likely secondary to anxiety as it has occurred after the patient recently lost her job and had been using marijuana.  P:  I recommended that the patient consider starting anxiolytic medicines, but she does not want to try one at this time. I recommended that she see at councilor at [facility], and she is agreeable with this plan. We will plan to f/u with the patient in 4 weeks.

## 2012-09-18 NOTE — Progress Notes (Signed)
Subjective:   Patient ID: Katherine Hutchinson female   DOB: 01/19/1976 37 y.o.   MRN: [id number]  HPI: [name] is a 37 y.o. female with a pmhx detailed below who comes to the clinic today for an ED f/u. She was seen 2x in the last week for SOB. The patient states that she was in her normal state of health until she started feeling SOB of breath on July 21. This was associated with neck/shoulder tightness. The patient had used marijuana and alcohol prior to feeling this. Over the next two days she had a nearly continuous episodes of SOB with teh shoulder tightness.  The patient went tot he ED after two days. CXR and EKG were unrevealing, and it was thought that her symptoms were likely 2/2 anxiety. The patient endorses recently losing her job and dealing with a lot of stress. She admits decrease sleeping and admits to occasional tingling sensation on her face. She denies feeling anxious. She denies using marijuana since the initial episode. The patient was prescribed flexeril and xanax and discharged from the ED. The patient tried Xanax for her symptoms 3 times, but felt no improvement, just sleepy. She did not try flexeril.   The patient had continued SOB and shoulder tightness and went to the ED a second time on July 29th. An EKG was unrevealing and the ED discharge her and recommended that she see her PCP.  The patient admits to 3 episodes of drenching night sweats during the last few weeks. She denies fevers or chills. She admit to mild nausea and regurgitating small amounts of food into her mouth. She denies diarrhea or constipation. She is currently on her period and has been having regular periods. She has had atypically large menstruation since complications of her sons birth many years ago. She is followed by [facility] for this and has an appointment in two weeks.  Of note, the patient thinks that her neck/shoulder pain is made worse due to her large pendulous breasts. Massage from her  boyfriend makes the pain better.  Past Medical History  Diagnosis Date  . Fibroids     uterine  . Anemia   . Obesity   . History of blood transfusion     after d&c  . H/O right breast biopsy     benign  . Chlamydia   . Morbid obesity   . FIBROIDS, UTERUS 06/04/2008    Qualifier: Diagnosis of  By: Polly Cobia MD, [name]    . Genital warts 03/06/2011  . Uterus, adenomyosis 01/17/2011    Suggested by transvaginal US in 09/2010    Current Outpatient Prescriptions  Medication Sig Dispense Refill  . ibuprofen (ADVIL,MOTRIN) 200 MG tablet Take 800 mg by mouth every 8 (eight) hours as needed for pain.      . promethazine (PHENERGAN) 25 MG tablet Take 1 tablet (25 mg total) by mouth every 6 (six) hours as needed for nausea.  12 tablet  0   No current facility-administered medications for this visit.   Family History  Problem Relation Age of Onset  . Hypertension Mother   . Stroke Maternal Aunt   . Hypertension Maternal Grandmother   . Anesthesia problems Neg Hx   . Hypertension Sister   . Heart disease Brother    History   Social History  . Marital Status: Single    Spouse Name: N/A    Number of Children: N/A  . Years of Education: N/A   Occupational History  .  food service    Social History Main Topics  . Smoking status: Never Smoker   . Smokeless tobacco: Never Used  . Alcohol Use: Yes     Comment: occasionally  . Drug Use: No  . Sexually Active: Yes    Birth Control/ Protection: None   Other Topics Concern  . None   Social History Narrative   Financial assistance approved for 100% discount at [facility] and has [facility] card per Rudell Cobb   02/01/2010         Review of Systems: Pertinent items are noted in HPI. Objective:  Physical Exam: Filed Vitals:   09/18/12 1444  BP: 117/73  Pulse: 67  Temp: 97 F (36.1 C)  TempSrc: Oral  Weight: 237 lb 12.8 oz (107.865 kg)  SpO2: 100%   Physical Exam  Constitutional: She is oriented to person, place, and time. She  appears well-developed and well-nourished.  Obese  HENT:  Head: Normocephalic and atraumatic.  Mouth/Throat: Oropharynx is clear and moist. No oropharyngeal exudate.  Eyes: EOM are normal. Pupils are equal, round, and reactive to light.  Neck: Normal range of motion. Neck supple. No thyromegaly present.  Cardiovascular: Normal rate, regular rhythm, normal heart sounds and intact distal pulses.  Exam reveals no gallop and no friction rub.   No murmur heard. Pulmonary/Chest: Effort normal and breath sounds normal. No respiratory distress. She has no wheezes. She has no rales.  Abdominal: Soft. Bowel sounds are normal. She exhibits no distension. There is no tenderness.  Musculoskeletal:  Mild tenderness to palpation in the left trapezius.  Lymphadenopathy:    She has no cervical adenopathy.  Neurological: She is alert and oriented to person, place, and time. No cranial nerve deficit.  Psychiatric: She has a normal mood and affect. Her behavior is normal.     Assessment & Plan:

## 2012-09-18 NOTE — Assessment & Plan Note (Signed)
A:  The patients neck/shoulder pain are likely due to myofascial pain. The patient is nort interested in pharmaceuticals.  P:  I have referred the patient to a chiropractor for evaluation and treatment.

## 2012-09-19 LAB — ANEMIA PANEL
%SAT: 5 % — ABNORMAL LOW (ref 20–55)
Folate: 15.6 ng/mL
Retic Ct Pct: 1 % (ref 0.4–2.3)
UIBC: 364 ug/dL (ref 125–400)
Vitamin B-12: 545 pg/mL (ref 211–911)

## 2012-09-19 NOTE — Progress Notes (Signed)
I saw and evaluated the patient.  I personally confirmed the key portions of the history and exam documented by Dr. Komanski and I reviewed pertinent patient test results.  The assessment, diagnosis, and plan were formulated together and I agree with the documentation in the resident's note.  

## 2012-09-22 ENCOUNTER — Emergency Department (HOSPITAL_COMMUNITY): Payer: Self-pay

## 2012-09-22 ENCOUNTER — Emergency Department (HOSPITAL_COMMUNITY)
Admission: EM | Admit: 2012-09-22 | Discharge: 2012-09-22 | Disposition: A | Discharge status: Home / Self Care | Payer: Self-pay | Attending: Emergency Medicine | Admitting: Emergency Medicine

## 2012-09-22 ENCOUNTER — Encounter: Payer: Self-pay | Admitting: Internal Medicine

## 2012-09-22 ENCOUNTER — Encounter (HOSPITAL_COMMUNITY): Payer: Self-pay | Admitting: *Deleted

## 2012-09-22 ENCOUNTER — Telehealth: Payer: Self-pay | Admitting: *Deleted

## 2012-09-22 DIAGNOSIS — R0789 Other chest pain: Secondary | ICD-10-CM

## 2012-09-22 DIAGNOSIS — Z862 Personal history of diseases of the blood and blood-forming organs and certain disorders involving the immune mechanism: Secondary | ICD-10-CM | POA: Insufficient documentation

## 2012-09-22 DIAGNOSIS — Z8619 Personal history of other infectious and parasitic diseases: Secondary | ICD-10-CM | POA: Insufficient documentation

## 2012-09-22 DIAGNOSIS — R0989 Other specified symptoms and signs involving the circulatory and respiratory systems: Secondary | ICD-10-CM | POA: Insufficient documentation

## 2012-09-22 DIAGNOSIS — R0609 Other forms of dyspnea: Secondary | ICD-10-CM | POA: Insufficient documentation

## 2012-09-22 DIAGNOSIS — R0600 Dyspnea, unspecified: Secondary | ICD-10-CM

## 2012-09-22 DIAGNOSIS — R0602 Shortness of breath: Secondary | ICD-10-CM | POA: Insufficient documentation

## 2012-09-22 DIAGNOSIS — R071 Chest pain on breathing: Secondary | ICD-10-CM | POA: Insufficient documentation

## 2012-09-22 DIAGNOSIS — Z8742 Personal history of other diseases of the female genital tract: Secondary | ICD-10-CM | POA: Insufficient documentation

## 2012-09-22 LAB — POCT I-STAT, CHEM 8
Calcium, Ion: 1.22 mmol/L (ref 1.12–1.23)
Glucose, Bld: 68 mg/dL — ABNORMAL LOW (ref 70–99)
HCT: 32 % — ABNORMAL LOW (ref 36.0–46.0)
Hemoglobin: 10.9 g/dL — ABNORMAL LOW (ref 12.0–15.0)
Potassium: 3.3 mEq/L — ABNORMAL LOW (ref 3.5–5.1)
TCO2: 22 mmol/L (ref 0–100)

## 2012-09-22 LAB — CBC
MCH: 23.6 pg — ABNORMAL LOW (ref 26.0–34.0)
MCHC: 31.8 g/dL (ref 30.0–36.0)
Platelets: 282 10*3/uL (ref 150–400)
RBC: 4.02 MIL/uL (ref 3.87–5.11)
RDW: 15.7 % — ABNORMAL HIGH (ref 11.5–15.5)

## 2012-09-22 LAB — POCT I-STAT TROPONIN I: Troponin i, poc: 0 ng/mL (ref 0.00–0.08)

## 2012-09-22 MED ORDER — IBUPROFEN 400 MG PO TABS
600.0000 mg | ORAL_TABLET | Freq: Once | ORAL | Status: AC
  Administered 2012-09-22: 600 mg via ORAL
  Filled 2012-09-22: qty 2

## 2012-09-22 MED ORDER — IBUPROFEN 600 MG PO TABS: 600.0000 mg | ORAL_TABLET | Freq: Three times a day (TID) | ORAL | Status: DC

## 2012-09-22 MED ORDER — IOHEXOL 350 MG/ML SOLN
80.0000 mL | Freq: Once | INTRAVENOUS | Status: AC | PRN
  Administered 2012-09-22: 80 mL via INTRAVENOUS

## 2012-09-22 NOTE — ED Notes (Addendum)
1920  Received report and introduced self to the pt.  Pt is in the bed relaxing and no change in status at this time will continue to monitor.  2030  Pt relaxing in the bed at this time will continue to monitor  2130  Pt waiting on CT family and friends at bedside  2210  Pt off to CT

## 2012-09-22 NOTE — Telephone Encounter (Signed)
I called pt in response to email she sent to Dr Everardo Beals stating she was having sharp/dull chest pain on left side of her heart.  She wants to see a heart specialist because the SOB is not better and scares her. I talked with Dr Vinie Sill , reviewed the chart and advised to have pt evaluated in ED today for these new problems. Pt called and asked to go to ED for evaluation.  She voices understanding. I also made clinic appointment to see about referral she requested and review of labs.

## 2012-09-22 NOTE — ED Notes (Addendum)
Pt was told by her pcp to come to ED for ekg for L sided dull/trhobbing chest pain.  Her pcp told her that it is anxiety, but pt "needs to know that she's ok".  Is also scheduled for an echo on Wed.  PT concerned b/c her brother died of chf at 35 yo.

## 2012-09-22 NOTE — ED Provider Notes (Signed)
CSN: [id number]     Arrival date & time 09/22/12  1804 History     First MD Initiated Contact with Patient 09/22/12 1959     Chief Complaint  Patient presents with  . Shortness of Breath  . Chest Pain   (Consider location/radiation/quality/duration/timing/severity/associated sxs/prior Treatment) HPI Pt with 2 weeks of SOB especially with exertion p/w 2-3 days of episodic sharp left chest pain. No cough, fever, chills. Has been seen in ED several times for similar complaints. No lower ext swelling or pain. No recent travel or surgery. No oral contraceptives.  Past Medical History  Diagnosis Date  . Fibroids     uterine  . Anemia   . Obesity   . History of blood transfusion     after d&c  . H/O right breast biopsy     benign  . Chlamydia   . Morbid obesity   . FIBROIDS, UTERUS 06/04/2008    Qualifier: Diagnosis of  By: Polly Cobia MD, [name]    . Genital warts 03/06/2011  . Uterus, adenomyosis 01/17/2011    Suggested by transvaginal US in 09/2010    Past Surgical History  Procedure Laterality Date  . Dilation and curettage, diagnostic / therapeutic    . Breast lumpectomy      benign  . Dilation and evacuation  04/19/2011    Procedure: DILATATION AND EVACUATION;  Surgeon: Lesly Dukes, MD;  Location: [facility];  Service: Gynecology;  Laterality: N/A;   Family History  Problem Relation Age of Onset  . Hypertension Mother   . Stroke Maternal Aunt   . Hypertension Maternal Grandmother   . Anesthesia problems Neg Hx   . Hypertension Sister   . Heart disease Brother    History  Substance Use Topics  . Smoking status: Never Smoker   . Smokeless tobacco: Never Used  . Alcohol Use: Yes     Comment: occasionally   OB History   Grav Para Term Preterm Abortions TAB SAB Ect Mult Living   6 3 3  2 2    3      Review of Systems  Constitutional: Negative for fever, chills and fatigue.  HENT: Negative for neck stiffness.   Respiratory: Positive for shortness of breath. Negative for  cough, wheezing and stridor.   Cardiovascular: Positive for chest pain. Negative for palpitations and leg swelling.  Gastrointestinal: Negative for nausea, vomiting and abdominal pain.  Musculoskeletal: Negative for back pain.  Skin: Negative for rash and wound.  Neurological: Negative for dizziness, speech difficulty, weakness, light-headedness, numbness and headaches.  All other systems reviewed and are negative.    Allergies  Shellfish-derived products  Home Medications   Current Outpatient Rx  Name  Route  Sig  Dispense  Refill  . ibuprofen (ADVIL,MOTRIN) 200 MG tablet   Oral   Take 800 mg by mouth every 8 (eight) hours as needed for pain.         . promethazine (PHENERGAN) 25 MG tablet   Oral   Take 1 tablet (25 mg total) by mouth every 6 (six) hours as needed for nausea.   12 tablet   0   . ibuprofen (ADVIL,MOTRIN) 600 MG tablet   Oral   Take 1 tablet (600 mg total) by mouth 3 (three) times daily after meals.   30 tablet   0    BP 129/67  Pulse 57  Temp(Src) 99.1 F (37.3 C) (Oral)  Resp 18  Ht 5\' 5"  (1.651 m)  Wt 237 lb (107.502  kg)  BMI 39.44 kg/m2  SpO2 100%  LMP 09/16/2012 Physical Exam  Nursing note and vitals reviewed. Constitutional: She is oriented to person, place, and time. She appears well-developed and well-nourished. No distress.  HENT:  Head: Normocephalic and atraumatic.  Mouth/Throat: Oropharynx is clear and moist.  Eyes: EOM are normal. Pupils are equal, round, and reactive to light.  Neck: Normal range of motion. Neck supple.  Cardiovascular: Normal rate and regular rhythm.   Pulmonary/Chest: Effort normal and breath sounds normal. No respiratory distress. She has no wheezes. She has no rales. She exhibits tenderness (mild chest TTP over L parasternal area. no crepitance or deformity).  Abdominal: Soft. Bowel sounds are normal. She exhibits no distension and no mass. There is no tenderness. There is no rebound and no guarding.   Musculoskeletal: Normal range of motion. She exhibits no edema and no tenderness.  No calf swelling or tenderness  Neurological: She is alert and oriented to person, place, and time.  Skin: Skin is warm and dry. No rash noted. No erythema.  Psychiatric: She has a normal mood and affect. Her behavior is normal.    ED Course   Procedures (including critical care time)  Labs Reviewed  CBC - Abnormal; Notable for the following:    Hemoglobin 9.5 (*)    HCT 29.9 (*)    MCV 74.4 (*)    MCH 23.6 (*)    RDW 15.7 (*)    All other components within normal limits  D-DIMER, QUANTITATIVE - Abnormal; Notable for the following:    D-Dimer, Quant 0.91 (*)    All other components within normal limits  POCT I-STAT, CHEM 8 - Abnormal; Notable for the following:    Potassium 3.3 (*)    Glucose, Bld 68 (*)    Hemoglobin 10.9 (*)    HCT 32.0 (*)    All other components within normal limits  POCT I-STAT TROPONIN I   Dg Chest 2 View  09/22/2012   *RADIOLOGY REPORT*  Clinical Data: Shortness of breath for 2 days, sharp chest pain, fatigue, dizziness  CHEST - 2 VIEW  Comparison: 09/10/2012  Findings: Upper normal heart size. Mediastinal contours and pulmonary vascularity normal. Lungs clear. No pleural effusion or pneumothorax. Bones unremarkable.  IMPRESSION: No acute abnormalities.   Original Report Authenticated By: Ulyses Southward, M.D.   Ct Angio Chest Pe W/cm &/or Wo Cm  09/22/2012   *RADIOLOGY REPORT*  Clinical Data: Shortness of breath and chest pain.  CT ANGIOGRAPHY CHEST  Technique:  Multidetector CT imaging of the chest using the standard protocol during bolus administration of intravenous contrast. Multiplanar reconstructed images including MIPs were obtained and reviewed to evaluate the vascular anatomy.  Contrast: 80mL OMNIPAQUE IOHEXOL 350 MG/ML SOLN  Comparison: Plain films of the chest 09/22/2012 at 18:52 hours.  Findings: No pulmonary embolus is identified.  There is no axillary, hilar or  mediastinal lymphadenopathy.  Cardiomegaly is noted.  No pleural or pericardial effusion.  The lungs are clear. Incidental visualized upper abdomen demonstrates a small cyst in the right hepatic lobe.  Right renal cyst is partially visualized. No focal bony abnormality is identified.  IMPRESSION:  1.  Negative for pulmonary embolus or acute disease. 2.  Cardiomegaly.   Original Report Authenticated By: Holley Dexter, M.D.   1. Left-sided chest wall pain   2. Dyspnea     Date: 09/22/2012  Rate: 58  Rhythm: sinus bradycardia  QRS Axis: normal  Intervals: normal  ST/T Wave abnormalities: normal  Conduction  Disutrbances:none  Narrative Interpretation:   Old EKG Reviewed: unchanged   MDM  Pt to f/u with cardiology for echo. No PE. Doubt CAD. Pain reproduced with chest wall palpation. Will treat for costochondritis.   Loren Racer, MD 09/22/12 [phone]

## 2012-09-23 ENCOUNTER — Ambulatory Visit: Payer: Self-pay

## 2012-09-24 ENCOUNTER — Ambulatory Visit (INDEPENDENT_AMBULATORY_CARE_PROVIDER_SITE_OTHER): Payer: Self-pay | Admitting: Internal Medicine

## 2012-09-24 ENCOUNTER — Encounter: Payer: Self-pay | Admitting: Internal Medicine

## 2012-09-24 VITALS — BP 137/82 | HR 63 | Temp 96.7°F | Ht 64.25 in | Wt 237.0 lb

## 2012-09-24 DIAGNOSIS — K219 Gastro-esophageal reflux disease without esophagitis: Secondary | ICD-10-CM

## 2012-09-24 DIAGNOSIS — E876 Hypokalemia: Secondary | ICD-10-CM

## 2012-09-24 DIAGNOSIS — F411 Generalized anxiety disorder: Secondary | ICD-10-CM

## 2012-09-24 DIAGNOSIS — I517 Cardiomegaly: Secondary | ICD-10-CM

## 2012-09-24 DIAGNOSIS — F419 Anxiety disorder, unspecified: Secondary | ICD-10-CM

## 2012-09-24 DIAGNOSIS — R079 Chest pain, unspecified: Secondary | ICD-10-CM

## 2012-09-24 DIAGNOSIS — R0602 Shortness of breath: Secondary | ICD-10-CM

## 2012-09-24 DIAGNOSIS — K5900 Constipation, unspecified: Secondary | ICD-10-CM

## 2012-09-24 DIAGNOSIS — D509 Iron deficiency anemia, unspecified: Secondary | ICD-10-CM

## 2012-09-24 MED ORDER — POTASSIUM CHLORIDE ER 10 MEQ PO TBCR: 20.0000 meq | EXTENDED_RELEASE_TABLET | Freq: Two times a day (BID) | ORAL | Status: DC

## 2012-09-24 MED ORDER — DOCUSATE SODIUM 100 MG PO CAPS: 100.0000 mg | ORAL_CAPSULE | Freq: Two times a day (BID) | ORAL | Status: DC

## 2012-09-24 MED ORDER — FERROUS SULFATE 325 (65 FE) MG PO TABS: 325.0000 mg | ORAL_TABLET | Freq: Three times a day (TID) | ORAL | Status: DC

## 2012-09-24 MED ORDER — ALPRAZOLAM 0.5 MG PO TABS: 0.5000 mg | ORAL_TABLET | Freq: Three times a day (TID) | ORAL | Status: DC | PRN

## 2012-09-24 MED ORDER — PANTOPRAZOLE SODIUM 40 MG PO TBEC: 40.0000 mg | DELAYED_RELEASE_TABLET | Freq: Every day | ORAL | Status: DC

## 2012-09-24 NOTE — Progress Notes (Signed)
Pt aware of appt for 2D echo [facility] 09/25/12 10AM. Mozel Burdett RN 09/24/12 2:20PM

## 2012-09-24 NOTE — Patient Instructions (Signed)
-  Follow up with the [facility] application.  -Start taking iron pills with foods three times per day. Take Colace twice per day to prevent constipation while you are taking iron.  -Take Xanax as needed for anxiety, panic attacks.  -Take Protonix for your hearthburn symptoms and to prevent stomach discomfort with iron supplements.  -Take the potassium pills tonight and tomorrow night. I provided a list of foods rick in potassium below.  -Follow up with [place] in one month for your anemia and low potassium or sooner depending on the results of your echocardiogram.     Hypokalemia can be treated with potassium supplements taken by mouth and/or adjustments in your current medications. A diet high in potassium is also helpful. Foods with high potassium content are:  Peas, lentils, lima beans, nuts, and dried fruit.  Whole grain and bran cereals and breads.  Fresh fruit, vegetables (bananas, cantaloupe, grapefruit, oranges, tomatoes, honeydew melons, potatoes).  Orange and tomato juices.  Meats. Marland Kitchen

## 2012-09-25 ENCOUNTER — Ambulatory Visit (HOSPITAL_COMMUNITY)
Admission: RE | Admit: 2012-09-25 | Discharge: 2012-09-25 | Disposition: A | Discharge status: Home / Self Care | Payer: Self-pay | Source: Ambulatory Visit | Attending: Internal Medicine | Admitting: Internal Medicine

## 2012-09-25 DIAGNOSIS — I517 Cardiomegaly: Secondary | ICD-10-CM | POA: Insufficient documentation

## 2012-09-25 DIAGNOSIS — R079 Chest pain, unspecified: Secondary | ICD-10-CM | POA: Insufficient documentation

## 2012-09-25 DIAGNOSIS — F411 Generalized anxiety disorder: Secondary | ICD-10-CM | POA: Insufficient documentation

## 2012-09-25 NOTE — Assessment & Plan Note (Addendum)
CTA negative for PE and aortic dissection. No lung crackles or pedal edema, no pulmonary edema on CXR from 09/22/12. Cardiac etiology not excluded but this could be secondary to anxiety and anemia as well. Bronchospasm seems less likely given no hx of asthma, no allergies, or triggers for her SOB but possible.  Cardiac work up per above.  Anemia tx with Fe supplementation.  Xanax PRN for anxiety.  Pt declined prescription for albuterol inhaler at this time.

## 2012-09-25 NOTE — Progress Notes (Signed)
Echocardiogram 2D Echocardiogram has been performed.  Katherine Hutchinson 09/25/2012, 11:40 AM

## 2012-09-25 NOTE — Assessment & Plan Note (Addendum)
She has had three pregnancies two with postpartum hemorrhaging, one miscarriage with hemorrhage. Her anemia/iron study on 08/21/12  revealed low iron (20), and low ferritin (2), with nl folate and B12 levels. She has had PICA symptoms with cravings for ice and powder.  Ferrous sulfate 325mg  TID with repeat CBC in 4-6 weeks at which time dose may be reduced to once daily if Hg level is normal.  Colace 100mg  BID to prevent constipation.

## 2012-09-25 NOTE — Assessment & Plan Note (Addendum)
She has had heartburn with occasional food regurgitation.  Rx Protonix 40mg  daily, counseled pt to avoid trigger foods such as spicy foods.

## 2012-09-25 NOTE — Assessment & Plan Note (Signed)
Left sided, present almost constantly, described as sharp pain at times. She has cardiomegaly per CTA. Given her family hx of a brother who died at age 35 of sudden cardiac death, she should be evaluated for hypertrophic cardiomyopathy with echocardiogram. Her EKGs so far have not demonstrated signs of LVH or q waves but have shown sinus arrhythmia.  Ordered 2D echo.  Will refer to Cardiology pending on results of 2D echo.  Xanax for anxiety.  Fe supplementation for anemia.

## 2012-09-25 NOTE — Progress Notes (Signed)
  Subjective:    Patient ID: Katherine Hutchinson, female    DOB: 11-06-1975, 37 y.o.   MRN: [id number]  HPI Katherine Hutchinson is a 37 year old woman with PMH of obesity, menorrhagia, and GERD who comes in accompanied by her mother for follow up from ED visit for evaluation of chest pain. She was seen in the ED on 8/4 for left sided chest pain that had been present constantly for a few days and was accompanied by shortness of breath. Her work up included a CTA that was negative for aortic dissection and pulmonary embolism but remarkable for cardiomegaly. Her EKG revealed sinus bradycardia with sinus arrythmia. Her POC troponin was not elevated. She was treated with Xanax for anxiety and her shortness of breath improved some. She continue to have left sided chest pain, however, with intermittent shortness of breath. She has had panic attacks since finding out she has cardiomegaly. She is extremely worried about her symptoms as her brother died at age 89 from sudden cardiac death.   Review of Systems  Constitutional: Negative for fever, chills, appetite change and fatigue.  HENT: Negative for congestion, rhinorrhea, sneezing and postnasal drip.   Eyes: Negative for discharge, redness and itching.  Respiratory: Positive for shortness of breath. Negative for cough.   Cardiovascular: Positive for chest pain. Negative for palpitations and leg swelling.  Gastrointestinal: Negative for abdominal pain.  Genitourinary: Negative for dysuria.  Musculoskeletal: Negative for back pain.  Skin: Positive for pallor. Negative for color change, rash and wound.  Neurological: Negative for dizziness, syncope and headaches.  Psychiatric/Behavioral: Negative for agitation. The patient is nervous/anxious.        Objective:   Physical Exam  Nursing note and vitals reviewed. Constitutional: She is oriented to person, place, and time. She appears well-developed and well-nourished. No distress.  Obese  HENT:  Head: Normocephalic  and atraumatic.  Eyes: Conjunctivae are normal. No scleral icterus.  Neck: No thyromegaly present.  Cardiovascular: Regular rhythm.   No murmur heard. Bradycardia  Pulmonary/Chest: Effort normal and breath sounds normal. No respiratory distress. She has no wheezes. She has no rales. She exhibits no tenderness.  Abdominal: Soft.  Musculoskeletal: Normal range of motion. She exhibits no edema.  Neurological: She is alert and oriented to person, place, and time.  Skin: Skin is warm and dry. No rash noted. She is not diaphoretic. No erythema. There is pallor.  Psychiatric: She has a normal mood and affect.  Anxious appearing.           Assessment & Plan:

## 2012-09-25 NOTE — Assessment & Plan Note (Addendum)
This problem has started recently after her chest pain started and she has had panic attacks off and on that responded to Xanax treatment. She is very concerned about the recent finding of cardiomegaly and worries about having a serious cardiac condition. The patient was counseled on the possibility of addition with Xanax and states that she will only take it if she has severe anxiety/panick attack. Refilled Xanax 0.5mg  TID PRN #10, this is a short term treatment as the pt herself expects to be less anxious once she has a 2D echo and other cardiac studies as appropriate. If this becomes a chronic issue, follow up with mental health for consideration of SSRI tx for anxiety may be indicated.

## 2012-09-26 ENCOUNTER — Other Ambulatory Visit: Payer: Self-pay | Admitting: Internal Medicine

## 2012-09-26 DIAGNOSIS — I499 Cardiac arrhythmia, unspecified: Secondary | ICD-10-CM

## 2012-09-26 DIAGNOSIS — R079 Chest pain, unspecified: Secondary | ICD-10-CM

## 2012-09-26 NOTE — Progress Notes (Signed)
Case discussed with Dr. Kennerly soon after the resident saw the patient.  We reviewed the resident's history and exam and pertinent patient test results.  I agree with the assessment, diagnosis, and plan of care documented in the resident's note. 

## 2012-10-01 ENCOUNTER — Telehealth: Payer: Self-pay | Admitting: Internal Medicine

## 2012-10-01 NOTE — Telephone Encounter (Signed)
Pt had called asking about her 2D echo results. I called her and discussed with pt that her 2D echo did not show a muscle or valve abnormalties to explain her cardiomegaly but I have referred her to Cardiology for further evaluation of her cardiomegaly and her arrhythmias. She will apply for the [facility] Card soon. I encouraged her to apply to [facility] as well for medication assistance. She states that she has been feeling better. I encouraged to make a follow up appointment with [place] as needed.

## 2012-10-02 ENCOUNTER — Ambulatory Visit: Payer: Self-pay | Admitting: Obstetrics & Gynecology

## 2012-10-07 ENCOUNTER — Ambulatory Visit: Payer: Self-pay

## 2012-10-13 ENCOUNTER — Ambulatory Visit: Payer: Self-pay

## 2012-10-14 ENCOUNTER — Ambulatory Visit: Payer: Self-pay

## 2012-11-12 ENCOUNTER — Encounter: Payer: Self-pay | Admitting: *Deleted

## 2012-11-17 ENCOUNTER — Encounter: Payer: Self-pay | Admitting: Cardiovascular Disease

## 2012-11-17 ENCOUNTER — Ambulatory Visit (INDEPENDENT_AMBULATORY_CARE_PROVIDER_SITE_OTHER): Payer: No Typology Code available for payment source | Admitting: Cardiovascular Disease

## 2012-11-17 VITALS — BP 120/80 | HR 68 | Ht 65.0 in | Wt 237.0 lb

## 2012-11-17 DIAGNOSIS — R0602 Shortness of breath: Secondary | ICD-10-CM

## 2012-11-17 DIAGNOSIS — K219 Gastro-esophageal reflux disease without esophagitis: Secondary | ICD-10-CM

## 2012-11-17 DIAGNOSIS — R079 Chest pain, unspecified: Secondary | ICD-10-CM

## 2012-11-17 NOTE — Assessment & Plan Note (Signed)
Discussed weight loss and low carb diet Continue protonix

## 2012-11-17 NOTE — Assessment & Plan Note (Signed)
Atypical seems to be related to stress with kids and anxiety.  In association with dyspnea and normal ECG would f/u with cardiopulmonary stress test

## 2012-11-17 NOTE — Assessment & Plan Note (Signed)
Seems functional  EF low normal with normal chest CT  No objective evidence of low output or breathing problem  Non smoker F/U cardiopulmonary stress test

## 2012-11-17 NOTE — Patient Instructions (Signed)
Your physician recommends that you schedule a follow-up appointment in:  AS NEEDED Your physician recommends that you continue on your current medications as directed. Please refer to the Current Medication list given to you today. Your physician has recommended that you have a cardiopulmonary stress test (CPX). CPX testing is a non-invasive measurement of heart and lung function. It replaces a traditional treadmill stress test. This type of test provides a tremendous amount of information that relates not only to your present condition but also for future outcomes. This test combines measurements of you ventilation, respiratory gas exchange in the lungs, electrocardiogram (EKG), blood pressure and physical response before, during, and following an exercise protocol.

## 2012-11-17 NOTE — Progress Notes (Signed)
Patient ID: Katherine Hutchinson, female   DOB: 1975-02-20, 37 y.o.   MRN: [id number] She was seen in the ED on 8/4 for left sided chest pain that had been present constantly for a few days and was accompanied by shortness of breath. Her work up included a CTA that was negative for aortic dissection and pulmonary embolism but remarkable for cardiomegaly. Her EKG revealed sinus bradycardia with sinus arrythmia. Her POC troponin was not elevated. She was treated with Xanax for anxiety and her shortness of breath improved some. She continue to have left sided chest pain, however, with intermittent shortness of breath. She has had panic attacks since finding out she has cardiomegaly. She is extremely worried about her symptoms as her brother died at age 22 from sudden cardiac death.  Echo reviewed Oct 11, 2022 and no valve disease normal RV EF 50-55%   Reviewed ECG;s and she has sinus arrhythmia but nothing else  Has some anxiety.  Brother died in prison and had CHF  Not clear that he had congenital issues  Does have GERD but symptoms not related to this  ROS: Denies fever, malais, weight loss, blurry vision, decreased visual acuity, cough, sputum, SOB, hemoptysis, pleuritic pain, palpitaitons, heartburn, abdominal pain, melena, lower extremity edema, claudication, or rash.  All other systems reviewed and negative  General: Affect appropriate Obese black female HEENT: normal Neck supple with no adenopathy JVP normal no bruits no thyromegaly Lungs clear with no wheezing and good diaphragmatic motion Heart:  S1/S2 no murmur, no rub, gallop or click PMI normal Abdomen: benighn, BS positve, no tenderness, no AAA no bruit.  No HSM or HJR Distal pulses intact with no bruits No edema Neuro non-focal Skin warm and dry No muscular weakness   Current Outpatient Prescriptions  Medication Sig Dispense Refill  . ALPRAZolam (XANAX) 0.5 MG tablet Take 1 tablet (0.5 mg total) by mouth 3 (three) times daily as needed for  sleep or anxiety.  10 tablet  0  . docusate sodium (COLACE) 100 MG capsule Take 1 capsule (100 mg total) by mouth 2 (two) times daily.  60 capsule  3  . ferrous sulfate 325 (65 FE) MG tablet Take 1 tablet (325 mg total) by mouth 3 (three) times daily with meals.  90 tablet  3  . ibuprofen (ADVIL,MOTRIN) 600 MG tablet Take 1 tablet (600 mg total) by mouth 3 (three) times daily after meals.  30 tablet  0  . pantoprazole (PROTONIX) 40 MG tablet Take 1 tablet (40 mg total) by mouth daily.  30 tablet  3  . potassium chloride (K-DUR) 10 MEQ tablet Take 2 tablets (20 mEq total) by mouth 2 (two) times daily.  4 tablet  0   No current facility-administered medications for this visit.    Allergies  Shellfish-derived products  Electrocardiogram:  SR sinus arrhythmia rate 58  QT 372 no pre excitation   Assessment and Plan

## 2012-12-01 ENCOUNTER — Encounter (HOSPITAL_COMMUNITY): Payer: No Typology Code available for payment source

## 2012-12-10 ENCOUNTER — Encounter: Payer: No Typology Code available for payment source | Admitting: Internal Medicine

## 2012-12-11 ENCOUNTER — Ambulatory Visit (HOSPITAL_COMMUNITY): Payer: No Typology Code available for payment source | Attending: Cardiovascular Disease

## 2012-12-11 DIAGNOSIS — R0602 Shortness of breath: Secondary | ICD-10-CM | POA: Insufficient documentation

## 2012-12-11 DIAGNOSIS — R079 Chest pain, unspecified: Secondary | ICD-10-CM | POA: Insufficient documentation

## 2012-12-12 DIAGNOSIS — R079 Chest pain, unspecified: Secondary | ICD-10-CM

## 2012-12-17 ENCOUNTER — Encounter: Payer: No Typology Code available for payment source | Admitting: Internal Medicine

## 2012-12-19 ENCOUNTER — Telehealth: Payer: Self-pay | Admitting: Cardiovascular Disease

## 2012-12-19 ENCOUNTER — Other Ambulatory Visit: Payer: Self-pay | Admitting: *Deleted

## 2012-12-19 DIAGNOSIS — F419 Anxiety disorder, unspecified: Secondary | ICD-10-CM

## 2012-12-19 NOTE — Telephone Encounter (Signed)
New Problem:  Pt states she is returning Christine's phone call. Please advise

## 2012-12-19 NOTE — Telephone Encounter (Signed)
SEE RESULTS NOTE  ON CPX  MESSAGE  ADDRESSED./CY

## 2012-12-19 NOTE — Telephone Encounter (Signed)
Pt only uses once in awhile as needed. Call pt when ready @ [phone]

## 2012-12-22 NOTE — Telephone Encounter (Signed)
Pt informed

## 2012-12-23 ENCOUNTER — Other Ambulatory Visit: Payer: No Typology Code available for payment source

## 2012-12-25 ENCOUNTER — Other Ambulatory Visit: Payer: Self-pay

## 2013-01-28 ENCOUNTER — Ambulatory Visit (INDEPENDENT_AMBULATORY_CARE_PROVIDER_SITE_OTHER): Payer: No Typology Code available for payment source | Admitting: Internal Medicine

## 2013-01-28 ENCOUNTER — Encounter: Payer: Self-pay | Admitting: Internal Medicine

## 2013-01-28 VITALS — BP 124/80 | HR 67 | Temp 97.5°F | Wt 239.8 lb

## 2013-01-28 DIAGNOSIS — D649 Anemia, unspecified: Secondary | ICD-10-CM

## 2013-01-28 DIAGNOSIS — F411 Generalized anxiety disorder: Secondary | ICD-10-CM

## 2013-01-28 DIAGNOSIS — K921 Melena: Secondary | ICD-10-CM

## 2013-01-28 DIAGNOSIS — F419 Anxiety disorder, unspecified: Secondary | ICD-10-CM

## 2013-01-28 LAB — CBC WITH DIFFERENTIAL/PLATELET
Basophils Absolute: 0 10*3/uL (ref 0.0–0.1)
Basophils Relative: 0 % (ref 0–1)
Eosinophils Relative: 4 % (ref 0–5)
HCT: 32.8 % — ABNORMAL LOW (ref 36.0–46.0)
MCH: 26.8 pg (ref 26.0–34.0)
MCHC: 34.1 g/dL (ref 30.0–36.0)
MCV: 78.5 fL (ref 78.0–100.0)
Monocytes Absolute: 0.6 10*3/uL (ref 0.1–1.0)
Monocytes Relative: 10 % (ref 3–12)
RDW: 14.6 % (ref 11.5–15.5)

## 2013-01-28 MED ORDER — ALPRAZOLAM 0.5 MG PO TABS: 0.5000 mg | ORAL_TABLET | Freq: Three times a day (TID) | ORAL | Status: DC | PRN

## 2013-01-28 NOTE — Patient Instructions (Addendum)
-  Please take xanax only as needed for anxiety (preferably at night time) -Avoid using ibuprofen, take tylenol instead for pain  -Will check your blood work today -Will have you come back in 1 month  -Nice meeting you and happy holidays!

## 2013-01-28 NOTE — Assessment & Plan Note (Addendum)
Assessment: Pt with anxiety since July due to new stressors (job loss, financial, relationship, health) and associated with dyspnea, chest tightness, muscle spasms/tension, hyperventilation, palpitations, tremors, and insomnia with negative cardiac work-up and relief of symptoms with anxiolytic therapy most likely due to generalized anxiety disorder vs panic disorder. Symptoms of anxiety, palpitations, tremors, diaphoresis (in July), and voice change may indicate thyroid dysfunction.    Plan: -Obtain TSH, free T4 to r/o thyroid dysfunction --> WNL -Xanax 0.5 mg TID PRN for 10 pills  (pt instructed this will not be refilled in the future due to risk of dependence and to think about long-term SSRI therapy if anxiety continues) -Consider pulmonary function testing to rule out asthma and obstructive/restrictive pulmonary disease

## 2013-01-28 NOTE — Assessment & Plan Note (Addendum)
Assessment: Pt with history of chronic iron-deficiency anemia on oral iron therapy and NSAID use with episodes of hematochezia for the past few months with no abdominal pain, change in BM, fever, or weight loss and found to be hemodynamically stable with positive FOBT with evidence of external hemorrhoids. Etiology may include hemorrhoids, diverticulosis, IBD, AVM, polyps, PUD, NSAID-induced gastritis, esophagitis, and malignancy.        Plan: -Obtain CBC --> Hg stable 11.2 above baseline 9-10 -Continue oral Ferrous sulfate 325 mg TID  -Pt instructed to avoid NSAID use -To return in 1 month- GI referral at that time (currently w/o insurance)  -Due to FH of pre-cancerous polyp, will need screening colonoscopy at age 89 if not earlier

## 2013-01-28 NOTE — Progress Notes (Signed)
Patient ID: Katherine Hutchinson, female   DOB: [birth year], 37 y.o.   MRN: [id number]   Subjective:   Patient ID: Katherine Hutchinson female   DOB: Oct 04, 1975 37 y.o.   MRN: [id number]  HPI: [name] is a 37 y.o. pleasant woman with past medical history of iron-deficiency anemia on iron therapy and GERD who presents with chief complaint of intermittent shortness of breath for the past 5 months.   Pt reports that since losing her job on August 21, 2012 she has had shortness of breath on and off that is worse when she is conscious of her breathing and not occupying her time. She also has at times chest tightness, shoulder/neck stiffness and tension, palpitations, tremors, twitching of her eye,  hyperventilation, numbness and tingling in her fingertips, and insomnia. She reports feeling anxious and stressed with worsening symptoms in the past 2 days due to relationship problems. She has been taking her father's Xanax (0.25 mg at bedtime) daily/every other day for the past couple of months which helps her relax and sleep at night. She was prescribed flexiril in the past but did not use it due to concerns of drowsiness. She feels that her mind is racing at night and is hopeful that starting a new job this coming Monday will occupy her time and ease her anxiety. She reports having 1 episode of possible panic attack where she felt she was going to die. She denies depressed mood, change in energy level, change in appetite, weight change, or loss of interest in activities. No fever, chills, heat intolerance (had sweating in July that resolved), cough, wheezing, and LE edema.  She does report possible change in her voice.    She was seen by cardiology for her symptoms of chest pain and dyspnea, CTA chest  (09/22/12) imaging finding of cardiomegaly, and evidence of sinus bradycardia and marked sinus arrythmia on 12-lead EKG on 09/22/12. She was found to have normal Echo on 09/25/12 (low normal EF 50-55%) and exercise intolerance  on cardiopulmonary exercise stress testing on 10/24 thought to be due to obesity. Her symptoms were thought to be non-cardiac in origin and likely related to symptoms of anxiety. Family history is significant for brother who was reported (on autopsy) to die from CHF at age 23 (was incarcerated at the time). She denies personal and family history of asthma or pulmonary disease. She does not smoke cigarettes.    She has history of uternine adenomyosis, submucosal fibroids, and postpartum hemorrhaging and miscarriage hemorrhaging. She was found to have iron-deficiency anemia on 08/21/12 and started on oral iron therapy which she is compliant with (takes iron BID). Also with past history of PICA with cravings for ice and powder. She reports that in the past few months she has noticed bright red blood intermixed with her stool and present when she wipes that is painless and most recently occurred last week. She denies hematemesis, dysphagia, odynophagia,  nausea, vomiting, abdominal pain, diarrhea, constipation, melena, or lightheadedness.  She is not aware if she has hemorrhoids and reports no straining when she has bowel movements. She has history of GERD that is controlled with PPI therapy. She uses ibuprofen as needed for pain and is not on aspirin. There is no family history of IBD or colon cancer but reports her mother recently had pre-cancerous polyp removed at age 21.       Past Medical History  Diagnosis Date  . Fibroids     uterine  . Anemia   .  Obesity   . History of blood transfusion     after d&c  . H/O right breast biopsy     benign  . Chlamydia   . Morbid obesity   . FIBROIDS, UTERUS 06/04/2008    Qualifier: Diagnosis of  By: Polly Cobia MD, [name]    . Genital warts 03/06/2011  . Uterus, adenomyosis 01/17/2011    Suggested by transvaginal US in 09/2010    Current Outpatient Prescriptions  Medication Sig Dispense Refill  . ALPRAZolam (XANAX) 0.5 MG tablet Take 1 tablet (0.5 mg total) by  mouth 3 (three) times daily as needed for sleep or anxiety.  10 tablet  0  . docusate sodium (COLACE) 100 MG capsule Take 1 capsule (100 mg total) by mouth 2 (two) times daily.  60 capsule  3  . ferrous sulfate 325 (65 FE) MG tablet Take 1 tablet (325 mg total) by mouth 3 (three) times daily with meals.  90 tablet  3  . ibuprofen (ADVIL,MOTRIN) 600 MG tablet Take 1 tablet (600 mg total) by mouth 3 (three) times daily after meals.  30 tablet  0  . pantoprazole (PROTONIX) 40 MG tablet Take 1 tablet (40 mg total) by mouth daily.  30 tablet  3  . potassium chloride (K-DUR) 10 MEQ tablet Take 2 tablets (20 mEq total) by mouth 2 (two) times daily.  4 tablet  0   No current facility-administered medications for this visit.   Family History  Problem Relation Age of Onset  . Hypertension Mother   . Stroke Maternal Aunt   . Hypertension Maternal Grandmother   . Anesthesia problems Neg Hx   . Hypertension Sister   . Heart disease Brother    History   Social History  . Marital Status: Single    Spouse Name: N/A    Number of Children: N/A  . Years of Education: N/A   Occupational History  . food service    Social History Main Topics  . Smoking status: Never Smoker   . Smokeless tobacco: Never Used  . Alcohol Use: Yes     Comment: occasionally  . Drug Use: No  . Sexual Activity: Yes    Birth Control/ Protection: None   Other Topics Concern  . None   Social History Narrative   Financial assistance approved for 100% discount at [facility] and has [facility] card per [employer]   02/01/2010         Review of Systems: Review of Systems  Constitutional: Negative for fever, chills, weight loss, malaise/fatigue and diaphoresis.  HENT: Positive for congestion (resolving). Negative for sore throat.        Possible change in voice Sinus pressure Eye twitching  Eyes: Negative for blurred vision.  Respiratory: Positive for shortness of breath. Negative for cough and wheezing.   Cardiovascular:  Positive for chest pain and palpitations. Negative for leg swelling.  Gastrointestinal: Positive for blood in stool. Negative for heartburn, nausea, vomiting, abdominal pain, diarrhea, constipation and melena.  Genitourinary: Negative for dysuria, urgency, frequency and hematuria.  Musculoskeletal: Positive for joint pain. Negative for myalgias.       Shoulder/neck tension and stiffness  Neurological: Positive for tingling (in fingertips), tremors and headaches. Negative for dizziness and weakness.  Psychiatric/Behavioral: Negative for depression. The patient is nervous/anxious and has insomnia.     Objective:  Physical Exam: Filed Vitals:   01/28/13 1423  BP: 124/80  Pulse: 67  Temp: 97.5 F (36.4 C)  TempSrc: Oral  Weight: 239 lb  12.8 oz (108.773 kg)  SpO2: 100%   Physical Exam  Constitutional: She is oriented to person, place, and time. She appears well-developed and well-nourished. No distress.  HENT:  Head: Normocephalic and atraumatic.  Right Ear: External ear normal.  Left Ear: External ear normal.  Nose: Nose normal.  Mouth/Throat: Oropharynx is clear and moist. No oropharyngeal exudate.  Eyes: Conjunctivae and EOM are normal. Pupils are equal, round, and reactive to light. Right eye exhibits no discharge. Left eye exhibits no discharge. No scleral icterus.  Right eye twitching during interview  Neck: Normal range of motion. Neck supple.  Cardiovascular: Normal rate, regular rhythm and normal heart sounds.   No murmur heard. Pulmonary/Chest: Effort normal and breath sounds normal. No respiratory distress. She has no wheezes. She has no rales.  Abdominal: Soft. Bowel sounds are normal. She exhibits no distension. There is no tenderness. There is no rebound and no guarding.  Genitourinary: Rectal exam shows external hemorrhoid. Rectal exam shows no internal hemorrhoid, no fissure, no mass, no tenderness and anal tone normal. Guaiac positive stool.  Musculoskeletal: Normal  range of motion. She exhibits no edema and no tenderness.  Neurological: She is alert and oriented to person, place, and time. No cranial nerve deficit. She exhibits normal muscle tone. Coordination normal.  -5/5 muscle strength -Normal sensation to light touch   Skin: Skin is warm and dry. No rash noted. She is not diaphoretic. No erythema. No pallor.  Psychiatric: Her behavior is normal. Judgment and thought content normal.  Anxious mood    Assessment & Plan:   Please see problem list for problem-based assessment and plan

## 2013-01-29 NOTE — Progress Notes (Signed)
I saw and evaluated the patient.  I personally confirmed the key portions of Dr. Rabbani's history and exam and reviewed pertinent patient test results.  The assessment, diagnosis, and plan were formulated together and I agree with the documentation in the resident's note. 

## 2013-03-20 ENCOUNTER — Ambulatory Visit: Payer: Self-pay | Admitting: Internal Medicine

## 2013-05-20 ENCOUNTER — Emergency Department (HOSPITAL_COMMUNITY)
Admission: EM | Admit: 2013-05-20 | Discharge: 2013-05-20 | Disposition: A | Discharge status: Home / Self Care | Payer: BC Managed Care – PPO | Attending: Emergency Medicine | Admitting: Emergency Medicine

## 2013-05-20 ENCOUNTER — Encounter (HOSPITAL_COMMUNITY): Payer: Self-pay | Admitting: Emergency Medicine

## 2013-05-20 DIAGNOSIS — B9789 Other viral agents as the cause of diseases classified elsewhere: Secondary | ICD-10-CM | POA: Insufficient documentation

## 2013-05-20 DIAGNOSIS — R059 Cough, unspecified: Secondary | ICD-10-CM

## 2013-05-20 DIAGNOSIS — E669 Obesity, unspecified: Secondary | ICD-10-CM | POA: Insufficient documentation

## 2013-05-20 DIAGNOSIS — Z8619 Personal history of other infectious and parasitic diseases: Secondary | ICD-10-CM | POA: Insufficient documentation

## 2013-05-20 DIAGNOSIS — B349 Viral infection, unspecified: Secondary | ICD-10-CM

## 2013-05-20 DIAGNOSIS — Z8742 Personal history of other diseases of the female genital tract: Secondary | ICD-10-CM | POA: Insufficient documentation

## 2013-05-20 DIAGNOSIS — Z862 Personal history of diseases of the blood and blood-forming organs and certain disorders involving the immune mechanism: Secondary | ICD-10-CM | POA: Insufficient documentation

## 2013-05-20 DIAGNOSIS — R05 Cough: Secondary | ICD-10-CM

## 2013-05-20 MED ORDER — BENZONATATE 100 MG PO CAPS: 100.0000 mg | ORAL_CAPSULE | Freq: Three times a day (TID) | ORAL | Status: DC

## 2013-05-20 MED ORDER — AZITHROMYCIN 250 MG PO TABS: 250.0000 mg | ORAL_TABLET | Freq: Every day | ORAL | Status: DC

## 2013-05-20 MED ORDER — GUAIFENESIN 100 MG/5ML PO LIQD: 100.0000 mg | ORAL | Status: DC | PRN

## 2013-05-20 NOTE — ED Provider Notes (Signed)
CSN: [id number]     Arrival date & time 05/20/13  1902 History  This chart was scribed for non-physician practitioner, Domenic Moras, PA-C working with Arbie Cookey, * by Einar Pheasant, ED scribe. This patient was seen in room TR06C/TR06C and the patient's care was started at 7:38 PM.     Chief Complaint  Patient presents with  . Sore Throat  . Cough   The history is provided by the patient. No language interpreter was used.   HPI Comments: Katherine Hutchinson is a 38 y.o. female who presents to the Emergency Department complaining of waxing and waning congestion that started 1 month ago. Pt is also complaining of associated productive cough with yellow sputum, voice hoarseness, sleep disturbances secondary to cough, rhinorrhea, and congestion. She reports taking alkaselzer and tea  with minimal relief. She states that the cough is worse in the morning. Pt has had several sick contacts. Denies any rash, abdominal pain, fever, chills, diaphoresis, nausea, emesis, or SOB.    Past Medical History  Diagnosis Date  . Fibroids     uterine  . Anemia   . Obesity   . History of blood transfusion     after d&c  . H/O right breast biopsy     benign  . Chlamydia   . Morbid obesity   . FIBROIDS, UTERUS 06/04/2008    Qualifier: Diagnosis of  By: [name], [name]    . Genital warts 03/06/2011  . Uterus, adenomyosis 01/17/2011    Suggested by transvaginal US in 09/2010    Past Surgical History  Procedure Laterality Date  . Dilation and curettage, diagnostic / therapeutic    . Breast lumpectomy      benign  . Dilation and evacuation  04/19/2011    Procedure: DILATATION AND EVACUATION;  Surgeon: Guss Bunde, MD;  Location: [facility];  Service: Gynecology;  Laterality: N/A;   Family History  Problem Relation Age of Onset  . Hypertension Mother   . Stroke Maternal Aunt   . Hypertension Maternal Grandmother   . Anesthesia problems Neg Hx   . Hypertension Sister   . Heart disease Brother     History  Substance Use Topics  . Smoking status: Never Smoker   . Smokeless tobacco: Never Used  . Alcohol Use: Yes     Comment: occasionally   OB History   Grav Para Term Preterm Abortions TAB SAB Ect Mult Living   6 3 3  2 2    3      Review of Systems  Constitutional: Negative for fever, chills and diaphoresis.  HENT: Positive for congestion, postnasal drip and voice change.   Respiratory: Positive for cough. Negative for shortness of breath.   Cardiovascular: Negative for chest pain.  Gastrointestinal: Negative for nausea, vomiting and abdominal pain.      Allergies  Shellfish-derived products  Home Medications  No current outpatient prescriptions on file.  Triage Vitals: BP 127/80  Pulse 84  Temp(Src) 98.7 F (37.1 C) (Oral)  Resp 18  SpO2 99%  LMP 05/15/2013  Physical Exam  Nursing note and vitals reviewed. Constitutional: She appears well-developed and well-nourished. No distress.  HENT:  Head: Normocephalic and atraumatic.  Right Ear: External ear normal.  Left Ear: External ear normal.  Nose: Nose normal.  Mouth/Throat: Oropharynx is clear and moist. No oropharyngeal exudate.  Postnasal drip noted.  Eyes: Conjunctivae are normal. Right eye exhibits no discharge. Left eye exhibits no discharge.  Neck: Neck supple. No  rigidity.  No Nuchal rigidity.  Cardiovascular: Normal rate, regular rhythm and normal heart sounds.  Exam reveals no gallop and no friction rub.   No murmur heard. Pulmonary/Chest: Effort normal and breath sounds normal. No respiratory distress.  Abdominal: Soft. She exhibits no distension. There is no tenderness.  Musculoskeletal: She exhibits no edema and no tenderness.  Lymphadenopathy:    She has no cervical adenopathy.  Neurological: She is alert.  Skin: Skin is warm and dry.  Psychiatric: She has a normal mood and affect. Her behavior is normal. Thought content normal.    ED Course  Procedures (including critical care  time)  DIAGNOSTIC STUDIES: Oxygen Saturation is 99% on RA, normal by my interpretation.    COORDINATION OF CARE: 7:45 PM- likely viral syndrome however due to the chronicity, Will prescribe Z-pac. Pt is afebrile, no hypoxia, and nontoxic appearance. Pt advised of plan for treatment and pt agrees.  Labs Review Labs Reviewed - No data to display Imaging Review No results found.   EKG Interpretation None      MDM   Final diagnoses:  Cough  Viral syndrome    BP 127/80  Pulse 84  Temp(Src) 98.7 F (37.1 C) (Oral)  Resp 18  SpO2 99%  LMP 05/15/2013   I personally performed the services described in this documentation, which was scribed in my presence. The recorded information has been reviewed and is accurate.      Domenic Moras, PA-C 05/20/13 1956

## 2013-05-20 NOTE — ED Notes (Signed)
PA at bedside.

## 2013-05-20 NOTE — Discharge Instructions (Signed)
Your symptoms is likely due to a viral infection. However due to the prolonged duration of your sickness, you may take Z-Pak as prescribed.  Take cough medications as prescribed.  Follow up with your doctor for further care.    Viral Infections A viral infection can be caused by different types of viruses.Most viral infections are not serious and resolve on their own. However, some infections may cause severe symptoms and may lead to further complications. SYMPTOMS Viruses can frequently cause:  Minor sore throat.  Aches and pains.  Headaches.  Runny nose.  Different types of rashes.  Watery eyes.  Tiredness.  Cough.  Loss of appetite.  Gastrointestinal infections, resulting in nausea, vomiting, and diarrhea. These symptoms do not respond to antibiotics because the infection is not caused by bacteria. However, you might catch a bacterial infection following the viral infection. This is sometimes called a "superinfection." Symptoms of such a bacterial infection may include:  Worsening sore throat with pus and difficulty swallowing.  Swollen neck glands.  Chills and a high or persistent fever.  Severe headache.  Tenderness over the sinuses.  Persistent overall ill feeling (malaise), muscle aches, and tiredness (fatigue).  Persistent cough.  Yellow, green, or brown mucus production with coughing. HOME CARE INSTRUCTIONS   Only take over-the-counter or prescription medicines for pain, discomfort, diarrhea, or fever as directed by your caregiver.  Drink enough water and fluids to keep your urine clear or pale yellow. Sports drinks can provide valuable electrolytes, sugars, and hydration.  Get plenty of rest and maintain proper nutrition. Soups and broths with crackers or rice are fine. SEEK IMMEDIATE MEDICAL CARE IF:   You have severe headaches, shortness of breath, chest pain, neck pain, or an unusual rash.  You have uncontrolled vomiting, diarrhea, or you are unable  to keep down fluids.  You or your child has an oral temperature above 102 F (38.9 C), not controlled by medicine.  Your baby is older than 3 months with a rectal temperature of 102 F (38.9 C) or higher.  Your baby is 80 months old or younger with a rectal temperature of 100.4 F (38 C) or higher. MAKE SURE YOU:   Understand these instructions.  Will watch your condition.  Will get help right away if you are not doing well or get worse. Document Released: 11/15/2004 Document Revised: 04/30/2011 Document Reviewed: 06/12/2010 [facility] Patient Information 2014 [place], [place].

## 2013-05-20 NOTE — ED Notes (Signed)
Pt c/o cough, nasal drainage and congestion along with a sore throat X 1 month. Has tried taking OTC meds at home but no relief. Productive cough with yellow mucus. Denies fevers at home.

## 2013-05-22 NOTE — ED Provider Notes (Signed)
Medical screening examination/treatment/procedure(s) were performed by non-physician practitioner and as supervising physician I was immediately available for consultation/collaboration.   EKG Interpretation None        Houston Siren III, MD 05/22/13 1620

## 2013-08-09 ENCOUNTER — Encounter (HOSPITAL_COMMUNITY): Payer: Self-pay | Admitting: Emergency Medicine

## 2013-08-09 ENCOUNTER — Emergency Department (HOSPITAL_COMMUNITY)
Admission: EM | Admit: 2013-08-09 | Discharge: 2013-08-09 | Disposition: A | Discharge status: Home / Self Care | Payer: BC Managed Care – PPO | Attending: Emergency Medicine | Admitting: Emergency Medicine

## 2013-08-09 DIAGNOSIS — Z862 Personal history of diseases of the blood and blood-forming organs and certain disorders involving the immune mechanism: Secondary | ICD-10-CM | POA: Insufficient documentation

## 2013-08-09 DIAGNOSIS — Z791 Long term (current) use of non-steroidal anti-inflammatories (NSAID): Secondary | ICD-10-CM | POA: Insufficient documentation

## 2013-08-09 DIAGNOSIS — K122 Cellulitis and abscess of mouth: Secondary | ICD-10-CM | POA: Insufficient documentation

## 2013-08-09 DIAGNOSIS — Z8742 Personal history of other diseases of the female genital tract: Secondary | ICD-10-CM | POA: Insufficient documentation

## 2013-08-09 DIAGNOSIS — Z79899 Other long term (current) drug therapy: Secondary | ICD-10-CM | POA: Insufficient documentation

## 2013-08-09 LAB — RAPID STREP SCREEN (MED CTR MEBANE ONLY): STREPTOCOCCUS, GROUP A SCREEN (DIRECT): NEGATIVE

## 2013-08-09 MED ORDER — PREDNISONE 10 MG PO TABS: 20.0000 mg | ORAL_TABLET | Freq: Every day | ORAL | Status: DC

## 2013-08-09 MED ORDER — DEXAMETHASONE SODIUM PHOSPHATE 10 MG/ML IJ SOLN
10.0000 mg | Freq: Once | INTRAMUSCULAR | Status: AC
  Administered 2013-08-09: 10 mg via INTRAMUSCULAR
  Filled 2013-08-09: qty 1

## 2013-08-09 MED ORDER — FAMOTIDINE 20 MG PO TABS: 20.0000 mg | ORAL_TABLET | Freq: Two times a day (BID) | ORAL | Status: DC

## 2013-08-09 MED ORDER — DEXAMETHASONE SODIUM PHOSPHATE 10 MG/ML IJ SOLN: 10.0000 mg | Freq: Once | INTRAMUSCULAR | Status: DC

## 2013-08-09 MED ORDER — DIPHENHYDRAMINE HCL 25 MG PO CAPS: 25.0000 mg | ORAL_CAPSULE | Freq: Four times a day (QID) | ORAL | Status: DC | PRN

## 2013-08-09 NOTE — ED Provider Notes (Signed)
CSN: [id number]     Arrival date & time 08/09/13  1020 History  This chart was scribed for Delos Haring, PA-C, working with Arbie Cookey, MD, by Elby Beck ED Scribe. This patient was seen in room TR05C/TR05C and the patient's care was started at 11:07 AM.   Chief Complaint  Patient presents with  . Sore Throat    The history is provided by the patient. No language interpreter was used.    HPI Comments: Katherine Hutchinson is a 38 y.o. female who presents to the Emergency Department complaining of swelling of her uvula noticed this morning. She had the fan on high blowing right into her mouth and noticed as the night went on it was getting more and more irritating and swollen. She states that she has associated "4/10" pain in her throat. She states that this pain is only with swallowing. She states that she was symptom free yesterday. She states that her symptoms seem to be improving significantly already as the day has progressed/ Denies a history of the same. Has never had angioedema, no CP, SOB.. She denies any other pain or symptoms. Pt does not appear distressed, is tolerating her secretions well and has no strider.    Past Medical History  Diagnosis Date  . Fibroids     uterine  . Anemia   . Obesity   . History of blood transfusion     after d&c  . H/O right breast biopsy     benign  . Chlamydia   . Morbid obesity   . FIBROIDS, UTERUS 06/04/2008    Qualifier: Diagnosis of  By: [name], [name]    . Genital warts 03/06/2011  . Uterus, adenomyosis 01/17/2011    Suggested by transvaginal US in 09/2010    Past Surgical History  Procedure Laterality Date  . Dilation and curettage, diagnostic / therapeutic    . Breast lumpectomy      benign  . Dilation and evacuation  04/19/2011    Procedure: DILATATION AND EVACUATION;  Surgeon: Guss Bunde, MD;  Location: [facility];  Service: Gynecology;  Laterality: N/A;   Family History  Problem Relation Age of Onset  .  Hypertension Mother   . Stroke Maternal Aunt   . Hypertension Maternal Grandmother   . Anesthesia problems Neg Hx   . Hypertension Sister   . Heart disease Brother    History  Substance Use Topics  . Smoking status: Never Smoker   . Smokeless tobacco: Never Used  . Alcohol Use: Yes     Comment: occasionally   OB History   Grav Para Term Preterm Abortions TAB SAB Ect Mult Living   6 3 3  2 2    3      Review of Systems  HENT: Positive for sore throat.        Swelling of uvula  All other systems reviewed and are negative.   Allergies  Shellfish-derived products  Home Medications   Prior to Admission medications   Medication Sig Start Date End Date Taking? Authorizing Provider  azithromycin (ZITHROMAX) 250 MG tablet Take 1 tablet (250 mg total) by mouth daily. 05/20/13   Domenic Moras, PA-C  benzonatate (TESSALON) 100 MG capsule Take 1 capsule (100 mg total) by mouth every 8 (eight) hours. 05/20/13   Domenic Moras, PA-C  diphenhydrAMINE (BENADRYL) 25 mg capsule Take 1 capsule (25 mg total) by mouth every 6 (six) hours as needed. 08/09/13   Linus Mako, PA-C  famotidine (PEPCID) 20 MG tablet Take 1 tablet (20 mg total) by mouth 2 (two) times daily. 08/09/13   Donis Pinder Marilu Favre, PA-C  guaiFENesin (ROBITUSSIN) 100 MG/5ML liquid Take 5-10 mLs (100-200 mg total) by mouth every 4 (four) hours as needed for cough. 05/20/13   Domenic Moras, PA-C  predniSONE (DELTASONE) 10 MG tablet Take 2 tablets (20 mg total) by mouth daily. 08/09/13   Linus Mako, PA-C   Triage Vitals: BP 123/90  Pulse 85  Resp 18  SpO2 100%  Physical Exam  Nursing note and vitals reviewed. Constitutional: She is oriented to person, place, and time. She appears well-developed and well-nourished. No distress.  HENT:  Head: Normocephalic and atraumatic.  Mouth/Throat: Mucous membranes are normal. No trismus in the jaw. Uvula swelling present. No dental abscesses or lacerations. No oropharyngeal exudate, posterior  oropharyngeal edema, posterior oropharyngeal erythema or tonsillar abscesses.  Uvula is enlarged. No exudates associated, the tonsils are well identified and non swollen. No redness to the throat.  Eyes: Conjunctivae and EOM are normal.  Neck: Neck supple. No tracheal deviation present.  Cardiovascular: Normal rate.   Pulmonary/Chest: Effort normal. No respiratory distress.  Musculoskeletal: Normal range of motion.  Neurological: She is alert and oriented to person, place, and time.  Skin: Skin is warm and dry.  Psychiatric: She has a normal mood and affect. Her behavior is normal.    ED Course  Procedures (including critical care time)  DIAGNOSTIC STUDIES: Oxygen Saturation is 100% on RA, normal by my interpretation.    COORDINATION OF CARE: 11:11 AM- Discussed plan to obtain a rapid strep screen, CBC and imaging of the soft tissue in pt's neck. Will also order Decadron. Pt advised of plan for treatment and pt agrees.  11:36 AM- Pt is very worried about cost of the visit. She declines X-ray and CBC, but she is willing to have the Decadron shot and be monitored for 2 hours. The patients symptoms are mild and she is not having fever, stridor, controlling secretions and already improving on arrival. I will monitor her in the ER for 2 hours to ensure continued improvement. So long as he continues to improve, I will not have her sign out AMA. She has been made aware of the risks of uvular swelling and that it can rapidly worsen. If this were to happen she would need to return to the ED ASAP.   I feel that the etiology is most likely traumatic from the fan.  Labs Review Labs Reviewed  RAPID STREP SCREEN  CULTURE, GROUP A STREP    Imaging Review No results found.   EKG Interpretation None      MDM   Final diagnoses:  Uvulitis    38 y.o.Marcee A Dillard's evaluation in the Emergency Department is complete. It has been determined that no acute conditions requiring further emergency  intervention are present at this time. The patient/guardian have been advised of the diagnosis and plan. We have discussed signs and symptoms that warrant return to the ED, such as changes or worsening in symptoms.  Vital signs are stable at discharge. Filed Vitals:   08/09/13 1049  BP: 123/90  Pulse: 85  Resp: 18    Patient/guardian has voiced understanding and agreed to follow-up with the PCP or specialist.  I personally performed the services described in this documentation, which was scribed in my presence. The recorded information has been reviewed and is accurate.   Linus Mako, PA-C 08/09/13 1149

## 2013-08-09 NOTE — Discharge Instructions (Signed)
Uvulitis  Uvulitis is redness and soreness (inflammation) of the uvula. The uvula is the small tongue-shaped piece of tissue in the back of your mouth.   CAUSES  Infection is a common cause of uvulitis. Infection of the uvula can be either viral or bacterial. Infectious uvulitis usually only occurs in association with another condition, such as inflammation and infection of the mouth or throat.   Other causes of uvulitis include:  · Trauma to the uvula.  · Swelling from excess fluid buildup (edema), which may be an allergic reaction.  · Inhalation of irritants, such as chemical agents, smoke, or steam.  DIAGNOSIS  Your caregiver can usually diagnose uvulitis through a physical examination. Bacterial uvulitis can be diagnosed through the results of the growth of samples of bodily substances taken from your mouth (cultures).  HOME CARE INSTRUCTIONS   · Rest as much as possible.  · Young children may suck on frozen juice bars or frozen ice pops. Older children and adults may gargle with a warm or cold liquid to help soothe the throat. (Mix ¼ tsp of salt in 8 oz of water, or use strong tea.)  · Use a cool-mist humidifier to lessen throat irritation and cough.  · Drink enough fluids to keep your urine clear or pale yellow.  · While the throat is very sore, eat soft or liquid foods such as milk, ice cream, soups, or milk drinks.  · Family members who develop a sore throat or fever should have a medical exam or throat culture.  · If your child has uvulitis and is taking antibiotic medicine, wait 24 hours or until his or her temperature is near normal (less than 100° F [37.8° C]) before allowing him or her to return to school or day care.  · Only take over-the-counter or prescription medicines for pain, discomfort, or fever as directed by your caregiver.  Ask when your test results will be ready. Make sure you get your test results.   SEEK MEDICAL CARE IF:   · You have an oral temperature above 102° F (38.9° C).  · You  develop large, tender lumps your the neck.  · Your child develops a rash.  · You cough up green, yellow-brown, or bloody substances.  SEEK IMMEDIATE MEDICAL CARE IF:   · You develop any new symptoms, such as vomiting, earache, severe headache, stiff neck, chest pain, or trouble breathing or swallowing.  · Your airway is blocked.  · You develop more severe throat pain along with drooling or voice changes.  Document Released: 09/16/2003 Document Revised: 04/30/2011 Document Reviewed: 04/13/2010  ExitCare® Patient Information ©2015 [employer]. This information is not intended to replace advice given to you by your health care provider. Make sure you discuss any questions you have with your health care provider.

## 2013-08-09 NOTE — ED Notes (Signed)
Pt presents to department for evaluation of sore throat. Onset this morning. 4/10 pain at the time. Pt is alert and oriented x4. NAD.

## 2013-08-10 NOTE — ED Provider Notes (Signed)
Medical screening examination/treatment/procedure(s) were performed by non-physician practitioner and as supervising physician I was immediately available for consultation/collaboration.   EKG Interpretation None        Houston Siren III, MD 08/10/13 [phone]

## 2013-08-11 LAB — CULTURE, GROUP A STREP

## 2013-10-25 ENCOUNTER — Other Ambulatory Visit: Payer: Self-pay | Admitting: Internal Medicine

## 2013-11-02 ENCOUNTER — Encounter: Payer: Self-pay | Admitting: Gastroenterology

## 2013-12-21 ENCOUNTER — Encounter (HOSPITAL_COMMUNITY): Payer: Self-pay | Admitting: Emergency Medicine

## 2013-12-31 ENCOUNTER — Ambulatory Visit (INDEPENDENT_AMBULATORY_CARE_PROVIDER_SITE_OTHER): Payer: Medicaid Other | Admitting: Gastroenterology

## 2013-12-31 ENCOUNTER — Encounter: Payer: Self-pay | Admitting: Gastroenterology

## 2013-12-31 VITALS — BP 120/78 | HR 68 | Ht 65.0 in | Wt 255.0 lb

## 2013-12-31 DIAGNOSIS — R1314 Dysphagia, pharyngoesophageal phase: Secondary | ICD-10-CM | POA: Insufficient documentation

## 2013-12-31 DIAGNOSIS — K921 Melena: Secondary | ICD-10-CM

## 2013-12-31 DIAGNOSIS — K219 Gastro-esophageal reflux disease without esophagitis: Secondary | ICD-10-CM

## 2013-12-31 NOTE — Progress Notes (Signed)
_                                                                                                                History of Present Illness:  Katherine Hutchinson a pleasant 38 year old Afro-American female referred for evaluation of indigestion, bloating, and Hemoccult-positive stool.  She is complaining of frequent pyrosis, excess belching and flatus and abdominal distention.  Symptoms have significantly improved with omeprazole.  She does complain of dysphagia to solids.  Patient has also noted blood mixed with her stools.  She denies rectal pain or change in bowel habits.  She apparently tested Hemoccult positive.  She has a microcytic anemia but also suffers from menorrhagia.   Past Medical History  Diagnosis Date  . Fibroids     uterine  . Anemia   . Obesity   . History of blood transfusion     after d&c  . H/O right breast biopsy     benign  . Chlamydia   . Morbid obesity   . FIBROIDS, UTERUS 06/04/2008    Qualifier: Diagnosis of  By: [name], [name]    . Genital warts 03/06/2011  . Uterus, adenomyosis 01/17/2011    Suggested by transvaginal US in 09/2010    Past Surgical History  Procedure Laterality Date  . Dilation and curettage, diagnostic / therapeutic    . Breast lumpectomy      benign  . Dilation and evacuation  04/19/2011    Procedure: DILATATION AND EVACUATION;  Surgeon: Guss Bunde, MD;  Location: [facility];  Service: Gynecology;  Laterality: N/A;   family history includes Colon cancer in her maternal grandfather; Colon polyps in her mother; Heart disease in her brother; Hypertension in her maternal grandmother, mother, and sister; Stroke in her maternal aunt. There is no history of Anesthesia problems. Current Outpatient Prescriptions  Medication Sig Dispense Refill  . ferrous sulfate 325 (65 FE) MG tablet Take 325 mg by mouth. Takes one twice a day for a week at a time when she gets SOB    . omeprazole (PRILOSEC) 20 MG capsule Take 20 mg by mouth  daily.     No current facility-administered medications for this visit.   Allergies as of 12/31/2013 - Review Complete 12/31/2013  Allergen Reaction Noted  . Shellfish-derived products Anaphylaxis, Itching, and Other (See Comments) 03/23/2010    reports that she has never smoked. She has never used smokeless tobacco. She reports that she drinks alcohol. She reports that she does not use illicit drugs.   Review of Systems: Pertinent positive and negative review of systems were noted in the above HPI section. All other review of systems were otherwise negative.  Vital signs were reviewed in today's medical record Physical Exam: General: obese female in no acute distress Skin: anicteric Head: Normocephalic and atraumatic Eyes:  sclerae anicteric, EOMI Ears: Normal auditory acuity Mouth: No deformity or lesions Neck: Supple, no masses or thyromegaly Lungs: Clear throughout to auscultation Heart: Regular rate and rhythm; no murmurs, rubs or bruits  Abdomen: Soft, non tender and non distended. No masses, hepatosplenomegaly or hernias noted. Normal Bowel sounds Rectal:deferred Musculoskeletal: Symmetrical with no gross deformities  Skin: No lesions on visible extremities Pulses:  Normal pulses noted Extremities: No clubbing, cyanosis, edema or deformities noted Neurological: Alert oriented x 4, grossly nonfocal Cervical Nodes:  No significant cervical adenopathy Inguinal Nodes: No significant inguinal adenopathy Psychological:  Alert and cooperative. Normal mood and affect  See Assessment and Plan under Problem List

## 2013-12-31 NOTE — Addendum Note (Signed)
Addended by: Oda Kilts on: 12/31/2013 04:45 PM   Modules accepted: Orders

## 2013-12-31 NOTE — Assessment & Plan Note (Signed)
Symptoms are very well controlled with omeprazole.  Plan to continue with the same.

## 2013-12-31 NOTE — Patient Instructions (Signed)
You have been scheduled for an endoscopy. Please follow written instructions given to you at your visit today. If you use inhalers (even only as needed), please bring them with you on the day of your procedure. Your physician has requested that you go to www.startemmi.com and enter the access code given to you at your visit today. This web site gives a general overview about your procedure. However, you should still follow specific instructions given to you by our office regarding your preparation for the procedure.  It has been recommended to you by your physician that you have a(n)colonoscopy completed. Per your request, we did not schedule the procedure(s) today. Please contact our office at [phone] should you decide to have the procedure completed. 

## 2013-12-31 NOTE — Assessment & Plan Note (Signed)
Rule out peptic stricture and eosinophilic esophagitis.    Recommendations #1 upper endoscopy with dilation as indicated

## 2013-12-31 NOTE — Assessment & Plan Note (Signed)
Limited rectal bleeding may be hemorrhoidal.  A more proximal colonic bleeding source should be ruled out, especially in view of Hemoccult-positive stool in the absence of overt bleeding.  Recommendations #1  Colonoscopy #2 treatment of hemorrhoids it that is determined to be the source for rectal bleeding

## 2014-01-24 ENCOUNTER — Encounter (HOSPITAL_COMMUNITY): Payer: Self-pay | Admitting: *Deleted

## 2014-01-24 ENCOUNTER — Emergency Department (HOSPITAL_COMMUNITY): Payer: Medicaid Other

## 2014-01-24 ENCOUNTER — Emergency Department (HOSPITAL_COMMUNITY)
Admission: EM | Admit: 2014-01-24 | Discharge: 2014-01-25 | Disposition: A | Discharge status: Home / Self Care | Payer: Medicaid Other | Attending: Emergency Medicine | Admitting: Emergency Medicine

## 2014-01-24 DIAGNOSIS — Z8542 Personal history of malignant neoplasm of other parts of uterus: Secondary | ICD-10-CM | POA: Insufficient documentation

## 2014-01-24 DIAGNOSIS — W19XXXA Unspecified fall, initial encounter: Secondary | ICD-10-CM

## 2014-01-24 DIAGNOSIS — Z79899 Other long term (current) drug therapy: Secondary | ICD-10-CM | POA: Diagnosis not present

## 2014-01-24 DIAGNOSIS — Z8619 Personal history of other infectious and parasitic diseases: Secondary | ICD-10-CM | POA: Diagnosis not present

## 2014-01-24 DIAGNOSIS — S82401A Unspecified fracture of shaft of right fibula, initial encounter for closed fracture: Secondary | ICD-10-CM

## 2014-01-24 DIAGNOSIS — Y9289 Other specified places as the place of occurrence of the external cause: Secondary | ICD-10-CM | POA: Diagnosis not present

## 2014-01-24 DIAGNOSIS — W010XXA Fall on same level from slipping, tripping and stumbling without subsequent striking against object, initial encounter: Secondary | ICD-10-CM | POA: Diagnosis not present

## 2014-01-24 DIAGNOSIS — S82434A Nondisplaced oblique fracture of shaft of right fibula, initial encounter for closed fracture: Secondary | ICD-10-CM | POA: Diagnosis not present

## 2014-01-24 DIAGNOSIS — Y9389 Activity, other specified: Secondary | ICD-10-CM | POA: Diagnosis not present

## 2014-01-24 DIAGNOSIS — D649 Anemia, unspecified: Secondary | ICD-10-CM | POA: Diagnosis not present

## 2014-01-24 DIAGNOSIS — S99911A Unspecified injury of right ankle, initial encounter: Secondary | ICD-10-CM | POA: Diagnosis present

## 2014-01-24 DIAGNOSIS — Y998 Other external cause status: Secondary | ICD-10-CM | POA: Diagnosis not present

## 2014-01-24 MED ORDER — HYDROCODONE-ACETAMINOPHEN 5-325 MG PO TABS
1.0000 | ORAL_TABLET | Freq: Once | ORAL | Status: AC
  Administered 2014-01-24: 1 via ORAL
  Filled 2014-01-24: qty 1

## 2014-01-24 MED ORDER — HYDROCODONE-ACETAMINOPHEN 5-325 MG PO TABS: 1.0000 | ORAL_TABLET | Freq: Four times a day (QID) | ORAL | Status: DC | PRN

## 2014-01-24 NOTE — ED Notes (Signed)
The pt slippoed and fell ion her hard wood floors.  She twisted her rt ankle and foot.  C/o pain and swelling.  More pain with weight bearing.  lmp last month

## 2014-01-24 NOTE — ED Provider Notes (Signed)
CSN: [id number]     Arrival date & time 01/24/14  2150 History   First MD Initiated Contact with Patient 01/24/14 2216     Chief Complaint  Patient presents with  . Ankle Injury     (Consider location/radiation/quality/duration/timing/severity/associated sxs/prior Treatment) HPI Comments: Slipped on hard wood floor twisting R ankle  Heard "pop" now with swelling. Denies numbness and tingling   Patient is a 38 y.o. female presenting with lower extremity injury. The history is provided by the patient.  Ankle Injury This is a new problem. The current episode started today. The problem occurs constantly. The problem has been unchanged. Associated symptoms include joint swelling. Pertinent negatives include no fever or numbness. The symptoms are aggravated by walking. She has tried NSAIDs for the symptoms. The treatment provided mild relief.    Past Medical History  Diagnosis Date  . Fibroids     uterine  . Anemia   . Obesity   . History of blood transfusion     after d&c  . H/O right breast biopsy     benign  . Chlamydia   . Morbid obesity   . FIBROIDS, UTERUS 06/04/2008    Qualifier: Diagnosis of  By: [name], [name]    . Genital warts 03/06/2011  . Uterus, adenomyosis 01/17/2011    Suggested by transvaginal US in 09/2010    Past Surgical History  Procedure Laterality Date  . Dilation and curettage, diagnostic / therapeutic    . Breast lumpectomy      benign  . Dilation and evacuation  04/19/2011    Procedure: DILATATION AND EVACUATION;  Surgeon: Guss Bunde, MD;  Location: [facility];  Service: Gynecology;  Laterality: N/A;   Family History  Problem Relation Age of Onset  . Hypertension Mother   . Stroke Maternal Aunt   . Hypertension Maternal Grandmother   . Anesthesia problems Neg Hx   . Hypertension Sister   . Heart disease Brother   . Colon cancer Maternal Grandfather   . Colon polyps Mother    History  Substance Use Topics  . Smoking status: Never Smoker    . Smokeless tobacco: Never Used  . Alcohol Use: Yes     Comment: occasionally   OB History    Gravida Para Term Preterm AB TAB SAB Ectopic Multiple Living   6 3 3  2 2    3      Review of Systems  Constitutional: Negative for fever.  Musculoskeletal: Positive for joint swelling.  Neurological: Negative for numbness.  All other systems reviewed and are negative.     Allergies  Shellfish-derived products  Home Medications   Prior to Admission medications   Medication Sig Start Date End Date Taking? Authorizing Provider  ferrous sulfate 325 (65 FE) MG tablet Take 325 mg by mouth. Takes one twice a day for a week at a time when she gets SOB    Historical Provider, MD  HYDROcodone-acetaminophen (NORCO/VICODIN) 5-325 MG per tablet Take 1 tablet by mouth every 6 (six) hours as needed for moderate pain. 01/24/14   Garald Balding, NP  omeprazole (PRILOSEC) 20 MG capsule Take 20 mg by mouth daily.    Historical Provider, MD   BP 139/87 mmHg  Pulse 82  Temp(Src) 98.6 F (37 C)  Resp 16  Ht 5\' 5"  (1.651 m)  Wt 242 lb (109.77 kg)  BMI 40.27 kg/m2  SpO2 100%  LMP 01/09/2014 (Approximate) Physical Exam  Constitutional: She is oriented to person, place,  and time. She appears well-developed and well-nourished.  Eyes: Pupils are equal, round, and reactive to light.  Pulmonary/Chest: Effort normal.  Musculoskeletal: Normal range of motion.  Neurological: She is alert and oriented to person, place, and time.  Skin: Skin is warm and dry.  Nursing note and vitals reviewed.   ED Course  Procedures (including critical care time) Labs Review Labs Reviewed - No data to display  Imaging Review Dg Ankle Complete Right  01/24/2014   CLINICAL DATA:  Additional evaluation for acute trauma.  EXAM: RIGHT ANKLE - COMPLETE 3+ VIEW  COMPARISON:  None.  FINDINGS: There is an acute oblique nondisplaced fracture through the distal fibula. There is overlying soft tissue swelling. Ankle mortise remains  approximated. Distal tibia and talar dome are intact. Lateral process of the talus is intact.  IMPRESSION: Acute oblique nondisplaced fracture of the distal fibula.   Electronically Signed   By: Jeannine Boga M.D.   On: 01/24/2014 22:55   Dg Foot Complete Right  01/24/2014   CLINICAL DATA:  Acute trauma.  Initial encounter.  EXAM: RIGHT FOOT COMPLETE - 3+ VIEW  COMPARISON:  Concomitant radiograph of the ankle.  FINDINGS: There is an acute oblique nondisplaced fracture through the distal fibular shaft, better evaluated on concomitant radiograph of the ankle. No other fracture about the foot. Joint spaces maintained without evidence of significant degenerative or erosive arthropathy. Small posterior plantar calcaneal enthesophytes noted. Soft tissue swelling present at the ankle. No other soft tissue abnormality. No radiopaque foreign body.  IMPRESSION: Acute oblique nondisplaced fracture through the distal fibular shaft, better evaluated on concomitant radiograph of the ankle. No other acute traumatic injury about the foot.   Electronically Signed   By: Jeannine Boga M.D.   On: 01/24/2014 22:58     EKG Interpretation None      MDM  Will place in CAM walker with Ortho follow up  Discuses xray results with patient and family Final diagnoses:  Fibula fracture, right, closed, initial encounter         Garald Balding, NP 01/24/14 [place], MD 01/25/14 1301

## 2014-01-24 NOTE — ED Notes (Signed)
Pt reports slipping on a tote lid that was on the hardwood floors and twisting her rt ankle. Visible swelling noted to ankle and foot. Pt rates pain 8/10. Pt unable to bear weight.

## 2014-01-24 NOTE — Progress Notes (Signed)
Orthopedic Tech Progress Note Patient Details:  Katherine Hutchinson November 02, 1975 [id number]  Ortho Devices Type of Ortho Device: CAM walker Ortho Device/Splint Interventions: Application   Katheren Shams 01/24/2014, 11:29 PM

## 2014-02-22 HISTORY — PX: TOOTH EXTRACTION: SUR596

## 2014-02-23 ENCOUNTER — Other Ambulatory Visit: Payer: Self-pay

## 2014-03-01 ENCOUNTER — Telehealth: Payer: Self-pay | Admitting: Gastroenterology

## 2014-03-01 NOTE — Telephone Encounter (Signed)
Patient wants to have a colonoscopy done on the same day as her EGD which is 03/09/14. I explained to her the lack of time for this. She is not presently scheduled for anything but the EGD with Dilation. She is 39 years old. Should we just wait to see how the EGD goes?

## 2014-03-01 NOTE — Telephone Encounter (Signed)
She does need a colonoscopy although we seldom to it on the same day

## 2014-03-02 NOTE — Telephone Encounter (Signed)
Patient advised. She is scheduled to return to her work on 03/22/14. She would like a date before then. At this point, the first opening is 04/05/14. I have told her I will keep looking for an opening for a colonoscopy.

## 2014-03-03 NOTE — Telephone Encounter (Signed)
Still no cancellations.

## 2014-03-08 NOTE — Telephone Encounter (Signed)
Patient has a dental appointment on 03/11/14. No new openings on the schedule.

## 2014-03-09 ENCOUNTER — Encounter: Payer: Self-pay | Admitting: Gastroenterology

## 2014-03-09 ENCOUNTER — Ambulatory Visit (AMBULATORY_SURGERY_CENTER): Payer: Medicaid Other | Admitting: Gastroenterology

## 2014-03-09 VITALS — BP 134/87 | HR 63 | Temp 98.5°F | Resp 16 | Ht 65.0 in | Wt 255.0 lb

## 2014-03-09 DIAGNOSIS — R1314 Dysphagia, pharyngoesophageal phase: Secondary | ICD-10-CM | POA: Diagnosis not present

## 2014-03-09 DIAGNOSIS — K222 Esophageal obstruction: Secondary | ICD-10-CM | POA: Diagnosis not present

## 2014-03-09 MED ORDER — SODIUM CHLORIDE 0.9 % IV SOLN: 500.0000 mL | INTRAVENOUS | Status: DC

## 2014-03-09 NOTE — Patient Instructions (Signed)
YOU HAD AN ENDOSCOPIC PROCEDURE TODAY AT [facility]: Refer to the procedure report that was given to you for any specific questions about what was found during the examination.  If the procedure report does not answer your questions, please call your gastroenterologist to clarify.  If you requested that your care partner not be given the details of your procedure findings, then the procedure report has been included in a sealed envelope for you to review at your convenience later.  YOU SHOULD EXPECT: Some feelings of bloating in the abdomen. Passage of more gas than usual.  Walking can help get rid of the air that was put into your GI tract during the procedure and reduce the bloating. If you had a lower endoscopy (such as a colonoscopy or flexible sigmoidoscopy) you may notice spotting of blood in your stool or on the toilet paper. If you underwent a bowel prep for your procedure, then you may not have a normal bowel movement for a few days.  DIET: NOTHING TO EAT OR DRINK UNTIL 5:30. 5:30 UNTIL 6:30 ONLY CLEAR LIQUIDS. AFTER 6:30 ONLY SOFT FOODS UNTIL THE MORNING. RESUME YOUR DIET IN THE AM.  ACTIVITY: Your care partner should take you home directly after the procedure.  You should plan to take it easy, moving slowly for the rest of the day.  You can resume normal activity the day after the procedure however you should NOT DRIVE or use heavy machinery for 24 hours (because of the sedation medicines used during the test).    SYMPTOMS TO REPORT IMMEDIATELY: A gastroenterologist can be reached at any hour.  During normal business hours, 8:30 AM to 5:00 PM Monday through Friday, call [phone].  After hours and on weekends, please call the GI answering service at [phone] who will take a message and have the physician on call contact you.   Following upper endoscopy (EGD)  Vomiting of blood or coffee ground material  New chest pain or pain under the shoulder  blades  Painful or persistently difficult swallowing  New shortness of breath  Fever of 100F or higher  Black, tarry-looking stools  FOLLOW UP: If any biopsies were taken you will be contacted by phone or by letter within the next 1-3 weeks.  Call your gastroenterologist if you have not heard about the biopsies in 3 weeks.  Our staff will call the home number listed on your records the next business day following your procedure to check on you and address any questions or concerns that you may have at that time regarding the information given to you following your procedure. This is a courtesy call and so if there is no answer at the home number and we have not heard from you through the emergency physician on call, we will assume that you have returned to your regular daily activities without incident.  SIGNATURES/CONFIDENTIALITY: You and/or your care partner have signed paperwork which will be entered into your electronic medical record.  These signatures attest to the fact that that the information above on your After Visit Summary has been reviewed and is understood.  Full responsibility of the confidentiality of this discharge information lies with you and/or your care-partner.

## 2014-03-09 NOTE — Op Note (Signed)
[facility]  [address] [place], [id number]   ENDOSCOPY PROCEDURE REPORT  PATIENT: Katherine Hutchinson, Katherine Hutchinson  MR#: [id number] BIRTHDATE: [birth year] , 38  yrs. old GENDER: female ENDOSCOPIST: Inda Castle, MD REFERRED BY: PROCEDURE DATE:  03/09/2014 PROCEDURE:  EGD, diagnostic and Maloney dilation of esophagus ASA CLASS:     Class II INDICATIONS:  dysphagia. MEDICATIONS: Monitored anesthesia care, Propofol 240 mg IV, and lidocaine 150 mg IV TOPICAL ANESTHETIC:  DESCRIPTION OF PROCEDURE: After the risks benefits and alternatives of the procedure were thoroughly explained, informed consent was obtained.  The LB [id number] endoscope was introduced through the mouth and advanced to the second portion of the duodenum , Without limitations.  The instrument was slowly withdrawn as the mucosa was fully examined.    ESOPHAGUS: There was a peptic stricture at the gastroesophageal junction.  The stricture was easily traversable.  The stricture was dilated using a 22mm (51Fr) Maloney dilator.   Except for the findings listed the EGD was otherwise normal.  Retroflexed views revealed no abnormalities.     The scope was then withdrawn from the patient and the procedure completed.  COMPLICATIONS: There were no immediate complications.  ENDOSCOPIC IMPRESSION: 1.   There was a stricture at the gastroesophageal junction; The stricture was dilated using a 28mm (51Fr) Maloney dilator 2.   EGD was otherwise normal  RECOMMENDATIONS: repeat dilation as needed  REPEAT EXAM:  eSigned:  Inda Castle, MD 03/09/2014 4:26 PM    CC:  Selina Cooley, MD

## 2014-03-09 NOTE — Progress Notes (Signed)
Report to PACU, RN, vss, BBS= Clear.  

## 2014-03-09 NOTE — Progress Notes (Signed)
Called to room to assist during endoscopic procedure.  Patient ID and intended procedure confirmed with present staff. Received instructions for my participation in the procedure from the performing physician.  

## 2014-03-10 ENCOUNTER — Telehealth: Payer: Self-pay | Admitting: *Deleted

## 2014-03-10 NOTE — Telephone Encounter (Signed)
  Follow up Call-  Call back number 03/09/2014  Post procedure Call Back phone  # [phone]  Permission to leave phone message Yes     Patient questions:  Do you have a fever, pain , or abdominal swelling? No. Pain Score  0 *  Have you tolerated food without any problems? Yes.    Have you been able to return to your normal activities? Yes.    Do you have any questions about your discharge instructions: Diet   No. Medications  No. Follow up visit  No.  Do you have questions or concerns about your Care? No.  Actions: * If pain score is 4 or above: No action needed, pain <4.

## 2014-03-10 NOTE — Telephone Encounter (Signed)
No openings

## 2014-03-11 ENCOUNTER — Encounter: Payer: Medicaid Other | Admitting: Gastroenterology

## 2014-06-18 ENCOUNTER — Other Ambulatory Visit: Payer: Self-pay | Admitting: Registered Nurse

## 2014-06-18 DIAGNOSIS — N6311 Unspecified lump in the right breast, upper outer quadrant: Secondary | ICD-10-CM

## 2014-07-07 ENCOUNTER — Other Ambulatory Visit: Payer: Medicaid Other

## 2014-07-07 ENCOUNTER — Ambulatory Visit: Payer: Medicaid Other

## 2014-07-07 ENCOUNTER — Ambulatory Visit
Admission: RE | Admit: 2014-07-07 | Discharge: 2014-07-07 | Disposition: A | Discharge status: Home / Self Care | Payer: Medicaid Other | Source: Ambulatory Visit | Attending: Registered Nurse | Admitting: Registered Nurse

## 2014-07-07 DIAGNOSIS — N6311 Unspecified lump in the right breast, upper outer quadrant: Secondary | ICD-10-CM

## 2014-07-29 ENCOUNTER — Encounter: Payer: Medicaid Other | Admitting: Gastroenterology

## 2014-08-17 ENCOUNTER — Other Ambulatory Visit: Payer: Self-pay | Admitting: Orthopaedic Surgery

## 2014-08-17 DIAGNOSIS — M79671 Pain in right foot: Secondary | ICD-10-CM

## 2014-08-19 ENCOUNTER — Inpatient Hospital Stay: Admission: RE | Admit: 2014-08-19 | Payer: Medicaid Other | Source: Ambulatory Visit

## 2014-10-12 ENCOUNTER — Encounter (HOSPITAL_COMMUNITY): Payer: Self-pay

## 2014-10-12 ENCOUNTER — Emergency Department (HOSPITAL_COMMUNITY): Payer: Medicaid Other

## 2014-10-12 ENCOUNTER — Emergency Department (HOSPITAL_COMMUNITY)
Admission: EM | Admit: 2014-10-12 | Discharge: 2014-10-12 | Disposition: A | Discharge status: Home / Self Care | Payer: Medicaid Other | Attending: Emergency Medicine | Admitting: Emergency Medicine

## 2014-10-12 DIAGNOSIS — Z8619 Personal history of other infectious and parasitic diseases: Secondary | ICD-10-CM | POA: Diagnosis not present

## 2014-10-12 DIAGNOSIS — R05 Cough: Secondary | ICD-10-CM | POA: Diagnosis not present

## 2014-10-12 DIAGNOSIS — Z862 Personal history of diseases of the blood and blood-forming organs and certain disorders involving the immune mechanism: Secondary | ICD-10-CM | POA: Insufficient documentation

## 2014-10-12 DIAGNOSIS — Z86018 Personal history of other benign neoplasm: Secondary | ICD-10-CM | POA: Insufficient documentation

## 2014-10-12 DIAGNOSIS — J069 Acute upper respiratory infection, unspecified: Secondary | ICD-10-CM | POA: Insufficient documentation

## 2014-10-12 DIAGNOSIS — Z8742 Personal history of other diseases of the female genital tract: Secondary | ICD-10-CM | POA: Insufficient documentation

## 2014-10-12 DIAGNOSIS — B9789 Other viral agents as the cause of diseases classified elsewhere: Secondary | ICD-10-CM

## 2014-10-12 DIAGNOSIS — J029 Acute pharyngitis, unspecified: Secondary | ICD-10-CM | POA: Diagnosis present

## 2014-10-12 LAB — RAPID STREP SCREEN (MED CTR MEBANE ONLY): STREPTOCOCCUS, GROUP A SCREEN (DIRECT): NEGATIVE

## 2014-10-12 MED ORDER — GUAIFENESIN ER 1200 MG PO TB12: 1.0000 | ORAL_TABLET | Freq: Two times a day (BID) | ORAL | Status: DC

## 2014-10-12 MED ORDER — IBUPROFEN 800 MG PO TABS: 800.0000 mg | ORAL_TABLET | Freq: Three times a day (TID) | ORAL | Status: DC | PRN

## 2014-10-12 MED ORDER — PROMETHAZINE-DM 6.25-15 MG/5ML PO SYRP: 5.0000 mL | ORAL_SOLUTION | Freq: Four times a day (QID) | ORAL | Status: DC | PRN

## 2014-10-12 NOTE — Discharge Instructions (Signed)
Return here as needed. Follow up with your primary care doctor. Increase your fluid intake. Rest as much as possible.

## 2014-10-12 NOTE — ED Provider Notes (Signed)
CSN: [id number]     Arrival date & time 10/12/14  0521 History   First MD Initiated Contact with Patient 10/12/14 0719     Chief Complaint  Patient presents with  . Sore Throat     (Consider location/radiation/quality/duration/timing/severity/associated sxs/prior Treatment) HPI   [name] is a 39 y/o Female who presents to the ED with a sore throat. She developed a productive cough of yellow/ brown sputum on Wednesday. Her sore throat got worse last night prompting her visit to the ED. She rates her pain a 7/10 on the pain scale. She denies fever, chills, lymphadenopathy, CP, SOB, palpitations. She does endorse night sweats that are not abnormal for her and an occasional headache.   Past Medical History  Diagnosis Date  . Fibroids     uterine  . Anemia   . Obesity   . History of blood transfusion     after d&c  . H/O right breast biopsy     benign  . Chlamydia   . Morbid obesity   . FIBROIDS, UTERUS 06/04/2008    Qualifier: Diagnosis of  By: [name], [name]    . Genital warts 03/06/2011  . Uterus, adenomyosis 01/17/2011    Suggested by transvaginal US in 09/2010    Past Surgical History  Procedure Laterality Date  . Dilation and curettage, diagnostic / therapeutic    . Breast lumpectomy      benign  . Dilation and evacuation  04/19/2011    Procedure: DILATATION AND EVACUATION;  Surgeon: Guss Bunde, MD;  Location: [facility];  Service: Gynecology;  Laterality: N/A;  . Tooth extraction  02/22/2014   Family History  Problem Relation Age of Onset  . Hypertension Mother   . Colon polyps Mother   . Stroke Maternal Aunt   . Hypertension Maternal Grandmother   . Anesthesia problems Neg Hx   . Stomach cancer Neg Hx   . Hypertension Sister   . Heart disease Brother   . Colon cancer Maternal Grandfather    Social History  Substance Use Topics  . Smoking status: Never Smoker   . Smokeless tobacco: Never Used  . Alcohol Use: Yes     Comment: occasionally   OB History     Gravida Para Term Preterm AB TAB SAB Ectopic Multiple Living   6 3 3  2 2    3      Review of Systems All other systems negative except as documented in the HPI. All pertinent positives and negatives as reviewed in the HPI.   Allergies  Shellfish-derived products  Home Medications   Prior to Admission medications   Medication Sig Start Date End Date Taking? Authorizing Provider  HYDROcodone-acetaminophen (NORCO/VICODIN) 5-325 MG per tablet Take 1 tablet by mouth every 6 (six) hours as needed for moderate pain. Patient not taking: Reported on 10/12/2014 01/24/14   [name], NP   BP 134/70 mmHg  Pulse 91  Temp(Src) 98.4 F (36.9 C) (Oral)  Resp 20  SpO2 100% Physical Exam  Constitutional: She is oriented to person, place, and time. She appears well-developed and well-nourished.  HENT:  Head: Normocephalic and atraumatic.  Eyes: Pupils are equal, round, and reactive to light.  Neck: Normal range of motion. Neck supple.  Cardiovascular: Normal rate, regular rhythm and normal heart sounds.  Exam reveals no gallop and no friction rub.   No murmur heard. Pulmonary/Chest: Effort normal and breath sounds normal. No respiratory distress. She has no wheezes.  Lymphadenopathy:  She has no cervical adenopathy.  Neurological: She is alert and oriented to person, place, and time. She exhibits normal muscle tone. Coordination normal.  Skin: Skin is warm and dry. No rash noted. No erythema.  Psychiatric: She has a normal mood and affect.    ED Course  Procedures (including critical care time) Labs Review Labs Reviewed  RAPID STREP SCREEN (NOT AT [facility])  CULTURE, GROUP A STREP    Patient will be treated for pharyngitis and upper respiratory infection.  Told to return here as needed.  Told to increase her fluid intake and rest as much possible        [name], PA-C 10/14/14 1600  Lacretia Leigh, MD 10/14/14 2312

## 2014-10-12 NOTE — ED Notes (Signed)
Discharge instructions and prescriptions given and reviewed with patient.  Patient verbalized understanding to take medications as directed and to follow up with PMD as needed.  Patient discharged home in good condition.  

## 2014-10-12 NOTE — ED Notes (Signed)
Pt complains of a sore throat and a cough for one week

## 2014-10-14 LAB — CULTURE, GROUP A STREP

## 2014-11-11 ENCOUNTER — Other Ambulatory Visit: Payer: Self-pay | Admitting: Obstetrics and Gynecology

## 2014-12-21 ENCOUNTER — Ambulatory Visit: Payer: Medicaid Other | Admitting: Gastroenterology

## 2015-05-21 ENCOUNTER — Encounter (HOSPITAL_COMMUNITY): Payer: Self-pay | Admitting: *Deleted

## 2015-05-21 ENCOUNTER — Emergency Department (HOSPITAL_COMMUNITY): Payer: Medicaid Other

## 2015-05-21 ENCOUNTER — Emergency Department (HOSPITAL_COMMUNITY)
Admission: EM | Admit: 2015-05-21 | Discharge: 2015-05-21 | Disposition: A | Discharge status: Home / Self Care | Payer: Medicaid Other | Attending: Emergency Medicine | Admitting: Emergency Medicine

## 2015-05-21 DIAGNOSIS — Z8619 Personal history of other infectious and parasitic diseases: Secondary | ICD-10-CM | POA: Insufficient documentation

## 2015-05-21 DIAGNOSIS — R0602 Shortness of breath: Secondary | ICD-10-CM | POA: Insufficient documentation

## 2015-05-21 DIAGNOSIS — R0600 Dyspnea, unspecified: Secondary | ICD-10-CM | POA: Insufficient documentation

## 2015-05-21 DIAGNOSIS — R0789 Other chest pain: Secondary | ICD-10-CM | POA: Insufficient documentation

## 2015-05-21 DIAGNOSIS — Z79899 Other long term (current) drug therapy: Secondary | ICD-10-CM | POA: Insufficient documentation

## 2015-05-21 DIAGNOSIS — Z86018 Personal history of other benign neoplasm: Secondary | ICD-10-CM | POA: Insufficient documentation

## 2015-05-21 DIAGNOSIS — Z862 Personal history of diseases of the blood and blood-forming organs and certain disorders involving the immune mechanism: Secondary | ICD-10-CM | POA: Insufficient documentation

## 2015-05-21 DIAGNOSIS — R002 Palpitations: Secondary | ICD-10-CM | POA: Insufficient documentation

## 2015-05-21 DIAGNOSIS — N9489 Other specified conditions associated with female genital organs and menstrual cycle: Secondary | ICD-10-CM | POA: Insufficient documentation

## 2015-05-21 LAB — CBC
HCT: 28.4 % — ABNORMAL LOW (ref 36.0–46.0)
Hemoglobin: 8.8 g/dL — ABNORMAL LOW (ref 12.0–15.0)
MCH: 23.5 pg — AB (ref 26.0–34.0)
MCHC: 31 g/dL (ref 30.0–36.0)
MCV: 75.7 fL — AB (ref 78.0–100.0)
PLATELETS: 295 10*3/uL (ref 150–400)
RBC: 3.75 MIL/uL — ABNORMAL LOW (ref 3.87–5.11)
RDW: 16.6 % — AB (ref 11.5–15.5)
WBC: 8.1 10*3/uL (ref 4.0–10.5)

## 2015-05-21 LAB — BASIC METABOLIC PANEL
Anion gap: 5 (ref 5–15)
BUN: 11 mg/dL (ref 6–20)
CALCIUM: 8.9 mg/dL (ref 8.9–10.3)
CHLORIDE: 109 mmol/L (ref 101–111)
CO2: 24 mmol/L (ref 22–32)
CREATININE: 1 mg/dL (ref 0.44–1.00)
GFR calc non Af Amer: >60 mL/min (ref >60)
Glucose, Bld: 113 mg/dL — ABNORMAL HIGH (ref 65–99)
Potassium: 3.7 mmol/L (ref 3.5–5.1)
SODIUM: 138 mmol/L (ref 135–145)

## 2015-05-21 LAB — I-STAT TROPONIN, ED: TROPONIN I, POC: 0 ng/mL (ref 0.00–0.08)

## 2015-05-21 MED ORDER — DOCUSATE SODIUM 100 MG PO CAPS: 100.0000 mg | ORAL_CAPSULE | Freq: Two times a day (BID) | ORAL | Status: DC

## 2015-05-21 NOTE — ED Notes (Signed)
Pt and family verbalized understanding of d/c instructions.  

## 2015-05-21 NOTE — ED Provider Notes (Signed)
CSN: [id number]     Arrival date & time 05/21/15  1953 History   First MD Initiated Contact with Patient 05/21/15 2111     Chief Complaint  Patient presents with  . Chest Pain     (Consider location/radiation/quality/duration/timing/severity/associated sxs/prior Treatment) HPI Complains of palpitations as if her heart is beating fast onset 3 days ago, he by chest pain left-sided anterior which lasts a split second at a time. No other associated symptoms. Reports that left neck feels "stiff,, like I need to crack it.". She does admit to exertional dyspnea for the past 2 years, which is unchanged. Denies any arm pain. No other associated symptoms. Nothing makes chest pain better or worse. Palpitations somewhat worse with lying on left side. Patient suffers from history of anemia. She reports that her hemoglobin was approximately 5 in September 2016. She's been on iron supplement which she does not take religiously as it constipates her Past Medical History  Diagnosis Date  . Fibroids     uterine  . Anemia   . Obesity   . History of blood transfusion     after d&c  . H/O right breast biopsy     benign  . Chlamydia   . Morbid obesity ([place])   . FIBROIDS, UTERUS 06/04/2008    Qualifier: Diagnosis of  By: [name], [name]    . Genital warts 03/06/2011  . Uterus, adenomyosis 01/17/2011    Suggested by transvaginal US in 09/2010    Past Surgical History  Procedure Laterality Date  . Dilation and curettage, diagnostic / therapeutic    . Breast lumpectomy      benign  . Dilation and evacuation  04/19/2011    Procedure: DILATATION AND EVACUATION;  Surgeon: Guss Bunde, MD;  Location: [facility];  Service: Gynecology;  Laterality: N/A;  . Tooth extraction  02/22/2014   Family History  Problem Relation Age of Onset  . Hypertension Mother   . Colon polyps Mother   . Stroke Maternal Aunt   . Hypertension Maternal Grandmother   . Anesthesia problems Neg Hx   . Stomach cancer Neg Hx   .  Hypertension Sister   . Heart disease Brother   . Colon cancer Maternal Grandfather    Social History  Substance Use Topics  . Smoking status: Never Smoker   . Smokeless tobacco: Never Used  . Alcohol Use: Yes     Comment: occasionally   OB History    Gravida Para Term Preterm AB TAB SAB Ectopic Multiple Living   6 3 3  2 2    3      Review of Systems  Respiratory: Positive for shortness of breath.        Chronic dyspnea on exertion  Cardiovascular: Positive for chest pain.  Genitourinary: Positive for menstrual problem.       Heavy menses  All other systems reviewed and are negative.     Allergies  Shellfish-derived products  Home Medications   Prior to Admission medications   Medication Sig Start Date End Date Taking? Authorizing Provider  Guaifenesin 1200 MG TB12 Take 1 tablet (1,200 mg total) by mouth 2 (two) times daily. 10/12/14   [name], PA-C  HYDROcodone-acetaminophen (NORCO/VICODIN) 5-325 MG per tablet Take 1 tablet by mouth every 6 (six) hours as needed for moderate pain. Patient not taking: Reported on 10/12/2014 01/24/14   [name], NP  ibuprofen (ADVIL,MOTRIN) 800 MG tablet Take 1 tablet (800 mg total) by mouth every 8 (eight) hours  as needed. 10/12/14   [name], PA-C  promethazine-dextromethorphan (PROMETHAZINE-DM) 6.25-15 MG/5ML syrup Take 5 mLs by mouth 4 (four) times daily as needed for cough. 10/12/14   [name], PA-C   BP 148/76 mmHg  Pulse 71  Temp(Src) 98.2 F (36.8 C) (Oral)  Resp 18  Ht 5\' 5"  (1.651 m)  Wt 258 lb (117.028 kg)  BMI 42.93 kg/m2  SpO2 100%  LMP 04/29/2015 Physical Exam  Constitutional: She appears well-developed and well-nourished.  HENT:  Head: Normocephalic and atraumatic.  Eyes: Conjunctivae are normal. Pupils are equal, round, and reactive to light.  Neck: Neck supple. No tracheal deviation present. No thyromegaly present.  Cardiovascular: Normal rate and regular rhythm.   No murmur  heard. Pulmonary/Chest: Effort normal and breath sounds normal.  Abdominal: Soft. Bowel sounds are normal. She exhibits no distension. There is no tenderness.  Obese  Musculoskeletal: Normal range of motion. She exhibits no edema or tenderness.  Neurological: She is alert. Coordination normal.  Skin: Skin is warm and dry. No rash noted.  Psychiatric: She has a normal mood and affect.  Nursing note and vitals reviewed.   ED Course  Procedures (including critical care time) Labs Review Labs Reviewed  BASIC METABOLIC PANEL - Abnormal; Notable for the following:    Glucose, Bld 113 (*)    All other components within normal limits  CBC - Abnormal; Notable for the following:    RBC 3.75 (*)    Hemoglobin 8.8 (*)    HCT 28.4 (*)    MCV 75.7 (*)    MCH 23.5 (*)    RDW 16.6 (*)    All other components within normal limits  Randolm Idol, ED    Imaging Review Dg Chest 2 View  05/21/2015  CLINICAL DATA:  Chest pain and heart palpitations. Chest tenderness. Shortness of breath. EXAM: CHEST  2 VIEW COMPARISON:  10/12/2014 FINDINGS: Midline trachea. Mild cardiomegaly. No pleural effusion or pneumothorax. No congestive failure. Clear lungs. IMPRESSION: Cardiomegaly without congestive failure. Electronically Signed   By: Abigail Miyamoto M.D.   On: 05/21/2015 21:10   I have personally reviewed and evaluated these images and lab results as part of my medical decision-making.   EKG Interpretation   Date/Time:  Saturday May 21 2015 20:11:39 EDT Ventricular Rate:  72 PR Interval:  149 QRS Duration: 86 QT Interval:  368 QTC Calculation: 403 R Axis:   65 Text Interpretation:  Sinus arrhythmia Consider left ventricular  hypertrophy No significant change since last tracing Confirmed by  Winfred Leeds  MD, Marshaun Lortie [phone]) on 05/21/2015 9:13:32 PM     Chest x-ray viewed by me Results for orders placed or performed during the hospital encounter of [id number]  Basic metabolic panel  Result Value Ref  Range   Sodium 138 135 - 145 mmol/L   Potassium 3.7 3.5 - 5.1 mmol/L   Chloride 109 101 - 111 mmol/L   CO2 24 22 - 32 mmol/L   Glucose, Bld 113 (H) 65 - 99 mg/dL   BUN 11 6 - 20 mg/dL   Creatinine, Ser 1.00 0.44 - 1.00 mg/dL   Calcium 8.9 8.9 - 10.3 mg/dL   GFR calc non Af Amer >60 >60 mL/min   GFR calc Af Amer >60 >60 mL/min   Anion gap 5 5 - 15  CBC  Result Value Ref Range   WBC 8.1 4.0 - 10.5 K/uL   RBC 3.75 (L) 3.87 - 5.11 MIL/uL   Hemoglobin 8.8 (L) 12.0 - 15.0 g/dL  HCT 28.4 (L) 36.0 - 46.0 %   MCV 75.7 (L) 78.0 - 100.0 fL   MCH 23.5 (L) 26.0 - 34.0 pg   MCHC 31.0 30.0 - 36.0 g/dL   RDW 16.6 (H) 11.5 - 15.5 %   Platelets 295 150 - 400 K/uL  I-stat troponin, ED (not at [facility], [facility])  Result Value Ref Range   Troponin i, poc 0.00 0.00 - 0.08 ng/mL   Comment 3           Dg Chest 2 View  05/21/2015  CLINICAL DATA:  Chest pain and heart palpitations. Chest tenderness. Shortness of breath. EXAM: CHEST  2 VIEW COMPARISON:  10/12/2014 FINDINGS: Midline trachea. Mild cardiomegaly. No pleural effusion or pneumothorax. No congestive failure. Clear lungs. IMPRESSION: Cardiomegaly without congestive failure. Electronically Signed   By: Abigail Miyamoto M.D.   On: 05/21/2015 21:10    MDM  Heart attack risk factors obesity, family history. Brother had MI at age 64 Final diagnoses:  None  . Chest pain highly atypical, near normal EKG. Heart score equals 1 based on risk factors, patient has seen cardiology group in the past and told she has an enlarged heart and requires follow-up area I'll prescribe Colace she states that iron supplement causes constipation. Referral back to [facility] and to [place] to get primary care physician Diagnosis #1 atypical chest pain #2 anemia #3 palpitations    [name], MD 05/21/15 2143

## 2015-05-21 NOTE — Discharge Instructions (Signed)
Nonspecific Chest Pain  Take your iron as prescribed, which will help with your anemia. Take the stool softener as prescribed which will help with constipation. Call [facility] in 2 days to schedule an office visit. Call the [facility] in [place] to get a primary care physician. Chest pain can be caused by many different conditions. There is always a chance that your pain could be related to something serious, such as a heart attack or a blood clot in your lungs. Chest pain can also be caused by conditions that are not life-threatening. If you have chest pain, it is very important to follow up with your health care provider. CAUSES  Chest pain can be caused by:  Heartburn.  Pneumonia or bronchitis.  Anxiety or stress.  Inflammation around your heart (pericarditis) or lung (pleuritis or pleurisy).  A blood clot in your lung.  A collapsed lung (pneumothorax). It can develop suddenly on its own (spontaneous pneumothorax) or from trauma to the chest.  Shingles infection (varicella-zoster virus).  Heart attack.  Damage to the bones, muscles, and cartilage that make up your chest wall. This can include:  Bruised bones due to injury.  Strained muscles or cartilage due to frequent or repeated coughing or overwork.  Fracture to one or more ribs.  Sore cartilage due to inflammation (costochondritis). RISK FACTORS  Risk factors for chest pain may include:  Activities that increase your risk for trauma or injury to your chest.  Respiratory infections or conditions that cause frequent coughing.  Medical conditions or overeating that can cause heartburn.  Heart disease or family history of heart disease.  Conditions or health behaviors that increase your risk of developing a blood clot.  Having had chicken pox (varicella zoster). SIGNS AND SYMPTOMS Chest pain can feel like:  Burning or tingling on the surface of your chest or deep in your  chest.  Crushing, pressure, aching, or squeezing pain.  Dull or sharp pain that is worse when you move, cough, or take a deep breath.  Pain that is also felt in your back, neck, shoulder, or arm, or pain that spreads to any of these areas. Your chest pain may come and go, or it may stay constant. DIAGNOSIS Lab tests or other studies may be needed to find the cause of your pain. Your health care provider may have you take a test called an ambulatory ECG (electrocardiogram). An ECG records your heartbeat patterns at the time the test is performed. You may also have other tests, such as:  Transthoracic echocardiogram (TTE). During echocardiography, sound waves are used to create a picture of all of the heart structures and to look at how blood flows through your heart.  Transesophageal echocardiogram (TEE).This is a more advanced imaging test that obtains images from inside your body. It allows your health care provider to see your heart in finer detail.  Cardiac monitoring. This allows your health care provider to monitor your heart rate and rhythm in real time.  Holter monitor. This is a portable device that records your heartbeat and can help to diagnose abnormal heartbeats. It allows your health care provider to track your heart activity for several days, if needed.  Stress tests. These can be done through exercise or by taking medicine that makes your heart beat more quickly.  Blood tests.  Imaging tests. TREATMENT  Your treatment depends on what is causing your chest pain. Treatment may include:  Medicines. These may include:  Acid blockers for  heartburn.  Anti-inflammatory medicine.  Pain medicine for inflammatory conditions.  Antibiotic medicine, if an infection is present.  Medicines to dissolve blood clots.  Medicines to treat coronary artery disease.  Supportive care for conditions that do not require medicines. This may include:  Resting.  Applying heat or cold  packs to injured areas.  Limiting activities until pain decreases. HOME CARE INSTRUCTIONS  If you were prescribed an antibiotic medicine, finish it all even if you start to feel better.  Avoid any activities that bring on chest pain.  Do not use any tobacco products, including cigarettes, chewing tobacco, or electronic cigarettes. If you need help quitting, ask your health care provider.  Do not drink alcohol.  Take medicines only as directed by your health care provider.  Keep all follow-up visits as directed by your health care provider. This is important. This includes any further testing if your chest pain does not go away.  If heartburn is the cause for your chest pain, you may be told to keep your head raised (elevated) while sleeping. This reduces the chance that acid will go from your stomach into your esophagus.  Make lifestyle changes as directed by your health care provider. These may include:  Getting regular exercise. Ask your health care provider to suggest some activities that are safe for you.  Eating a heart-healthy diet. A registered dietitian can help you to learn healthy eating options.  Maintaining a healthy weight.  Managing diabetes, if necessary.  Reducing stress. SEEK MEDICAL CARE IF:  Your chest pain does not go away after treatment.  You have a rash with blisters on your chest.  You have a fever. SEEK IMMEDIATE MEDICAL CARE IF:   Your chest pain is worse.  You have an increasing cough, or you cough up blood.  You have severe abdominal pain.  You have severe weakness.  You faint.  You have chills.  You have sudden, unexplained chest discomfort.  You have sudden, unexplained discomfort in your arms, back, neck, or jaw.  You have shortness of breath at any time.  You suddenly start to sweat, or your skin gets clammy.  You feel nauseous or you vomit.  You suddenly feel light-headed or dizzy.  Your heart begins to beat quickly, or  it feels like it is skipping beats. These symptoms may represent a serious problem that is an emergency. Do not wait to see if the symptoms will go away. Get medical help right away. Call your local emergency services (911 in the [place] Do not drive yourself to the hospital.   This information is not intended to replace advice given to you by your health care provider. Make sure you discuss any questions you have with your health care provider.   Document Released: 11/15/2004 Document Revised: 02/26/2014 Document Reviewed: 09/11/2013 Elsevier Interactive Patient Education [employer].

## 2015-05-21 NOTE — ED Notes (Addendum)
Pt states that she is having left sided chest pain described as aching and states "my heart feels weak"; pt states that she is having heart palpitations described as feeling of racing intermittently; pt also c/o feeling to tense to left of neck, shoulder and left arm; pt states that she has a hx of anemia and has had to have blood transfusions in the past

## 2015-09-02 ENCOUNTER — Emergency Department (HOSPITAL_COMMUNITY)
Admission: EM | Admit: 2015-09-02 | Discharge: 2015-09-02 | Disposition: A | Discharge status: Home / Self Care | Payer: Medicaid Other | Attending: Emergency Medicine | Admitting: Emergency Medicine

## 2015-09-02 ENCOUNTER — Encounter (HOSPITAL_COMMUNITY): Payer: Self-pay | Admitting: Oncology

## 2015-09-02 DIAGNOSIS — F1290 Cannabis use, unspecified, uncomplicated: Secondary | ICD-10-CM | POA: Insufficient documentation

## 2015-09-02 DIAGNOSIS — H9312 Tinnitus, left ear: Secondary | ICD-10-CM | POA: Insufficient documentation

## 2015-09-02 LAB — HEMOGLOBIN AND HEMATOCRIT, BLOOD
HCT: 30.5 % — ABNORMAL LOW (ref 36.0–46.0)
HEMOGLOBIN: 9.2 g/dL — AB (ref 12.0–15.0)

## 2015-09-02 LAB — I-STAT CHEM 8, ED
BUN: 12 mg/dL (ref 6–20)
Calcium, Ion: 1.22 mmol/L (ref 1.13–1.30)
Chloride: 103 mmol/L (ref 101–111)
Creatinine, Ser: 0.8 mg/dL (ref 0.44–1.00)
Glucose, Bld: 92 mg/dL (ref 65–99)
HEMATOCRIT: 29 % — AB (ref 36.0–46.0)
HEMOGLOBIN: 9.9 g/dL — AB (ref 12.0–15.0)
POTASSIUM: 3.6 mmol/L (ref 3.5–5.1)
Sodium: 139 mmol/L (ref 135–145)
TCO2: 25 mmol/L (ref 0–100)

## 2015-09-02 MED ORDER — NAPROXEN 500 MG PO TABS: 500.0000 mg | ORAL_TABLET | Freq: Two times a day (BID) | ORAL | Status: DC

## 2015-09-02 MED ORDER — PREDNISONE 10 MG (21) PO TBPK: 10.0000 mg | ORAL_TABLET | Freq: Every day | ORAL | Status: DC

## 2015-09-02 MED ORDER — ANTIPYRINE-BENZOCAINE 5.4-1.4 % OT SOLN: 3.0000 [drp] | OTIC | Status: DC | PRN

## 2015-09-02 NOTE — ED Provider Notes (Signed)
CSN: [id number]     Arrival date & time 09/02/15  1912 History  By signing my name below, I, Katherine Hutchinson, attest that this documentation has been prepared under the direction and in the presence of [name], PA-C.  Electronically Signed: Tedra Hutchinson. Sheppard Coil, ED Scribe. 09/02/2015. 8:33 PM.    Chief Complaint  Patient presents with  . Head Pressure     The history is provided by the patient. No language interpreter was used.    HPI Comments: Katherine Hutchinson is a 40 y.o. female with PMHx of Anemia and GERD who presents to the Emergency Department complaining of constant, "whooshing" in the left ear x 5 days. Patient states that she has had a similar sensation in the past and it was when her hemoglobin was especially low. Patient has chronic fatigue, nausea, and lightheadedness due to her anemia, but does not complain of these during the current interview. States her hemoglobin usually runs around 9 or 10, but has been as low as 6. Patient denies LOC, vomiting, chest pain, shortness of breath, or any other complaints.     Past Medical History  Diagnosis Date  . Fibroids     uterine  . Anemia   . Obesity   . History of blood transfusion     after d&c  . H/O right breast biopsy     benign  . Chlamydia   . Morbid obesity ([place])   . FIBROIDS, UTERUS 06/04/2008    Qualifier: Diagnosis of  By: [name], [name]    . Genital warts 03/06/2011  . Uterus, adenomyosis 01/17/2011    Suggested by transvaginal US in 09/2010    Past Surgical History  Procedure Laterality Date  . Dilation and curettage, diagnostic / therapeutic    . Breast lumpectomy      benign  . Dilation and evacuation  04/19/2011    Procedure: DILATATION AND EVACUATION;  Surgeon: Guss Bunde, MD;  Location: [facility];  Service: Gynecology;  Laterality: N/A;  . Tooth extraction  02/22/2014   Family History  Problem Relation Age of Onset  . Hypertension Mother   . Colon polyps Mother   . Stroke Maternal Aunt   .  Hypertension Maternal Grandmother   . Anesthesia problems Neg Hx   . Stomach cancer Neg Hx   . Hypertension Sister   . Heart disease Brother   . Colon cancer Maternal Grandfather    Social History  Substance Use Topics  . Smoking status: Never Smoker   . Smokeless tobacco: Never Used  . Alcohol Use: Yes     Comment: occasionally   OB History    Gravida Para Term Preterm AB TAB SAB Ectopic Multiple Living   6 3 3  2 2    3      Review of Systems  Constitutional: Positive for fatigue (chronic).  HENT: Positive for sinus pressure and tinnitus. Negative for ear discharge, ear pain and sore throat.        Head and left ear pressure. "Whooshing" sound in left ear.  Respiratory: Negative for shortness of breath.   Cardiovascular: Negative for chest pain.  Neurological: Negative for syncope, weakness and numbness.  All other systems reviewed and are negative.   Allergies  Shellfish-derived products  Home Medications   Prior to Admission medications   Medication Sig Start Date End Date Taking? Authorizing Provider  antipyrine-benzocaine Toniann Fail) otic solution Place 3-4 drops into the left ear every 2 (two) hours as needed for  ear pain. 09/02/15   Shawn C Joy, PA-C  docusate sodium (COLACE) 100 MG capsule Take 1 capsule (100 mg total) by mouth every 12 (twelve) hours. 05/21/15   [name], MD  Guaifenesin 1200 MG TB12 Take 1 tablet (1,200 mg total) by mouth 2 (two) times daily. 10/12/14   [name], PA-C  HYDROcodone-acetaminophen (NORCO/VICODIN) 5-325 MG per tablet Take 1 tablet by mouth every 6 (six) hours as needed for moderate pain. Patient not taking: Reported on 10/12/2014 01/24/14   [name], NP  ibuprofen (ADVIL,MOTRIN) 800 MG tablet Take 1 tablet (800 mg total) by mouth every 8 (eight) hours as needed. 10/12/14   [name], PA-C  naproxen (NAPROSYN) 500 MG tablet Take 1 tablet (500 mg total) by mouth 2 (two) times daily. 09/02/15   Shawn C Joy, PA-C   predniSONE (STERAPRED UNI-PAK 21 TAB) 10 MG (21) TBPK tablet Take 1 tablet (10 mg total) by mouth daily. Take 6 tabs by mouth daily  for 2 days, then 5 tabs for 2 days, then 4 tabs for 2 days, then 3 tabs for 2 days, 2 tabs for 2 days, then 1 tab by mouth daily for 2 days 09/02/15   Shawn C Joy, PA-C  promethazine-dextromethorphan (PROMETHAZINE-DM) 6.25-15 MG/5ML syrup Take 5 mLs by mouth 4 (four) times daily as needed for cough. 10/12/14   [name], PA-C   BP 132/75 mmHg  Pulse 75  Temp(Src) 97.8 F (36.6 C) (Oral)  Resp 15  Ht 5\' 3"  (1.6 m)  Wt 250 lb (113.399 kg)  BMI 44.30 kg/m2  SpO2 100%  LMP 08/31/2015 (Exact Date) Physical Exam  Constitutional: She is oriented to person, place, and time. She appears well-developed and well-nourished. No distress.  HENT:  Head: Normocephalic and atraumatic.  Left Ear: A middle ear effusion is present.  Mild effusion behind left TM.  Eyes: Conjunctivae and EOM are normal. Pupils are equal, round, and reactive to light.  Neck: Normal range of motion. Neck supple.  Cardiovascular: Normal rate, regular rhythm and intact distal pulses.   Pulmonary/Chest: Effort normal. No respiratory distress.  Abdominal: There is no guarding.  Musculoskeletal: She exhibits no edema or tenderness.  Lymphadenopathy:    She has no cervical adenopathy.  Neurological: She is alert and oriented to person, place, and time. She has normal reflexes.  No sensory deficits. Strength 5/5 in all extremities. No gait disturbance. Coordination intact. Cranial nerves III-XII grossly intact. No facial droop.   Skin: Skin is warm and dry. She is not diaphoretic.  Psychiatric: She has a normal mood and affect. Her behavior is normal.  Nursing note and vitals reviewed.   ED Course  Procedures (including critical care time) DIAGNOSTIC STUDIES: Oxygen Saturation is 100% on RA, normal by my interpretation.    COORDINATION OF CARE: 8:30 PM-Discussed treatment plan which  includes hemoglobin and hematocrit panel with pt at bedside and pt agreed to plan.   Labs Review Labs Reviewed  HEMOGLOBIN AND HEMATOCRIT, BLOOD - Abnormal; Notable for the following:    Hemoglobin 9.2 (*)    HCT 30.5 (*)    All other components within normal limits  I-STAT CHEM 8, ED - Abnormal; Notable for the following:    Hemoglobin 9.9 (*)    HCT 29.0 (*)    All other components within normal limits   HEMOGLOBIN  Date Value Ref Range Status  09/02/2015 9.9* 12.0 - 15.0 g/dL Final  09/02/2015 9.2* 12.0 - 15.0 g/dL Final  05/21/2015 8.8* 12.0 -  15.0 g/dL Final  01/28/2013 11.2* 12.0 - 15.0 g/dL Final    MDM   Final diagnoses:  Tinnitus of left ear    Evarose A Jagoda presents with a "whooshing" sound in her left ear for the last 5 days. Also complains of head and ear pressure.  Patient has no neuro deficits. Suspect the sound the patient is hearing is best described as tinnitus. Patient has an appointment next week with an ENT. The unilateral nature of the patient's tinnitus and effusion to be due to inner ear or middle ear inflammation. Patient encouraged to keep her appointment next week. The patient was given instructions for home care as well as return precautions. Patient voices understanding of these instructions, accepts the plan, and is comfortable with discharge.   Filed Vitals:   09/02/15 1929 09/02/15 2139  BP: 132/75 131/76  Pulse: 75 65  Temp: 97.8 F (36.6 C)   TempSrc: Oral   Resp: 15 14  Height: 5\' 3"  (1.6 m)   Weight: 113.399 kg   SpO2: 100% 100%    I personally performed the services described in this documentation, which was scribed in my presence. The recorded information has been reviewed and is accurate.   Lorayne Bender, PA-C 09/02/15 2205  Virgel Manifold, MD 09/02/15 2350

## 2015-09-02 NOTE — Discharge Instructions (Signed)
You have been seen today for ear and head pressure.  Your labs are stable from previous values. Keep your appointment with the ENT next week, as scheduled. It is advisable that you have someone follow you for your anemia, whether it be your PCP or a hematologist. Return to ED as needed. Decongestants may help relieve some of your symptoms of pressure.  Use the ear drops as needed for ear discomfort.

## 2015-09-02 NOTE — ED Notes (Signed)
Per pt she has been experiencing a, "Whooshing" between her ears leading to a pressure in her head.  Pt also c/o neck tightness.  States that she has a known hx of anemia and this month her menstrual cycle was very heavy w/ multiple clots.  Pt is concerned that, "My blood might be low."

## 2015-09-02 NOTE — ED Notes (Signed)
Patient d/c'd self care.  F/U and medications discussed.  Patient verbalized understanding. 

## 2015-09-15 ENCOUNTER — Emergency Department (HOSPITAL_COMMUNITY): Payer: Medicaid Other

## 2015-09-15 ENCOUNTER — Emergency Department (HOSPITAL_COMMUNITY)
Admission: EM | Admit: 2015-09-15 | Discharge: 2015-09-15 | Disposition: A | Discharge status: Home / Self Care | Payer: Medicaid Other | Attending: Emergency Medicine | Admitting: Emergency Medicine

## 2015-09-15 ENCOUNTER — Encounter (HOSPITAL_COMMUNITY): Payer: Self-pay | Admitting: Adult Health

## 2015-09-15 DIAGNOSIS — R51 Headache: Secondary | ICD-10-CM | POA: Insufficient documentation

## 2015-09-15 DIAGNOSIS — Z79899 Other long term (current) drug therapy: Secondary | ICD-10-CM | POA: Insufficient documentation

## 2015-09-15 DIAGNOSIS — G4452 New daily persistent headache (NDPH): Secondary | ICD-10-CM

## 2015-09-15 MED ORDER — IBUPROFEN 800 MG PO TABS
800.0000 mg | ORAL_TABLET | Freq: Once | ORAL | Status: AC
Start: 2015-09-15 — End: 2015-09-15
  Administered 2015-09-15: 800 mg via ORAL
  Filled 2015-09-15: qty 1

## 2015-09-15 NOTE — ED Triage Notes (Signed)
Presents with headache that began about 2 weeks ago and comes and goes assocaited with light sensitivty, sound sensitivity and tinnitus. "I am little scared because I think we might have viral meningitis, I feel foggy headed and off balance with the headaches" reports slight frontal lobe headahce at this time rated 4/10.

## 2015-09-15 NOTE — ED Provider Notes (Signed)
[facility] DEPT Provider Note   CSN: [id number] Arrival date & time: 09/15/15  [id number]  First Provider Contact:  First MD Initiated Contact with Patient 09/15/15 1959        History   Chief Complaint Chief Complaint  Patient presents with  . Headache    HPI Katherine Hutchinson is a 40 y.o. female.  The history is provided by the patient.  Headache   This is a new problem. The current episode started more than 1 week ago. The problem occurs constantly. The problem has been gradually worsening. The headache is associated with bright light. The pain is located in the frontal region. The pain is moderate. The pain does not radiate. Associated symptoms include nausea. Pertinent negatives include no fever.  Patient presents for ongoing HA for over 2 weeks that has been present daily No fever/vomiting No travel No tick bites No rash No focal weakness She reports she feels confused and dizzy at times No LOC She reports tinnitus and has already seen by ENT but still having HA  Daughter has had HA at home but no one else and no CO exposure  No h/o migraines  Past Medical History:  Diagnosis Date  . Anemia   . Chlamydia   . Fibroids    uterine  . FIBROIDS, UTERUS 06/04/2008   Qualifier: Diagnosis of  By: [name], [name]    . Genital warts 03/06/2011  . H/O right breast biopsy    benign  . History of blood transfusion    after d&c  . Morbid obesity ([place])   . Obesity   . Uterus, adenomyosis 01/17/2011   Suggested by transvaginal US in 09/2010     Patient Active Problem List   Diagnosis Date Noted  . Dysphagia, pharyngoesophageal phase 12/31/2013  . Hematochezia 01/28/2013  . Chest pain 09/25/2012  . Anxiety state, unspecified 09/25/2012  . SOB (shortness of breath) 09/18/2012  . GERD (gastroesophageal reflux disease) 09/18/2012  . Macular rash 11/07/2011  . Drug rash 06/14/2011  . Irregular periods 04/04/2011  . Menorrhagia with regular cycle 11/09/2010  . Chronic  back pain 11/09/2010  . Preventative health care 11/09/2010  . Obesity 11/09/2010  . Microcytic anemia 05/11/2008    Past Surgical History:  Procedure Laterality Date  . BREAST LUMPECTOMY     benign  . DILATION AND CURETTAGE, DIAGNOSTIC / THERAPEUTIC    . DILATION AND EVACUATION  04/19/2011   Procedure: DILATATION AND EVACUATION;  Surgeon: Guss Bunde, MD;  Location: [facility];  Service: Gynecology;  Laterality: N/A;  . TOOTH EXTRACTION  02/22/2014    OB History    Gravida Para Term Preterm AB Living   6 3 3   2 3    SAB TAB Ectopic Multiple Live Births     2             Home Medications    Prior to Admission medications   Medication Sig Start Date End Date Taking? Authorizing Provider  antipyrine-benzocaine Toniann Fail) otic solution Place 3-4 drops into the left ear every 2 (two) hours as needed for ear pain. 09/02/15   Shawn C Joy, PA-C  docusate sodium (COLACE) 100 MG capsule Take 1 capsule (100 mg total) by mouth every 12 (twelve) hours. 05/21/15   [name], MD  Guaifenesin 1200 MG TB12 Take 1 tablet (1,200 mg total) by mouth 2 (two) times daily. 10/12/14   [name], PA-C  HYDROcodone-acetaminophen (NORCO/VICODIN) 5-325 MG per tablet Take 1 tablet by mouth  every 6 (six) hours as needed for moderate pain. Patient not taking: Reported on 10/12/2014 01/24/14   [name], NP  ibuprofen (ADVIL,MOTRIN) 800 MG tablet Take 1 tablet (800 mg total) by mouth every 8 (eight) hours as needed. 10/12/14   [name], PA-C  naproxen (NAPROSYN) 500 MG tablet Take 1 tablet (500 mg total) by mouth 2 (two) times daily. 09/02/15   Shawn C Joy, PA-C  predniSONE (STERAPRED UNI-PAK 21 TAB) 10 MG (21) TBPK tablet Take 1 tablet (10 mg total) by mouth daily. Take 6 tabs by mouth daily  for 2 days, then 5 tabs for 2 days, then 4 tabs for 2 days, then 3 tabs for 2 days, 2 tabs for 2 days, then 1 tab by mouth daily for 2 days 09/02/15   Shawn C Joy, PA-C  promethazine-dextromethorphan  (PROMETHAZINE-DM) 6.25-15 MG/5ML syrup Take 5 mLs by mouth 4 (four) times daily as needed for cough. 10/12/14   [name], PA-C    Family History Family History  Problem Relation Age of Onset  . Hypertension Mother   . Colon polyps Mother   . Stroke Maternal Aunt   . Hypertension Maternal Grandmother   . Anesthesia problems Neg Hx   . Stomach cancer Neg Hx   . Hypertension Sister   . Heart disease Brother   . Colon cancer Maternal Grandfather     Social History Social History  Substance Use Topics  . Smoking status: Never Smoker  . Smokeless tobacco: Never Used  . Alcohol use Yes     Comment: occasionally     Allergies   Shellfish-derived products   Review of Systems Review of Systems  Constitutional: Negative for fever.  Gastrointestinal: Positive for nausea.  Genitourinary: Negative for vaginal bleeding.  Neurological: Positive for headaches. Negative for weakness.  All other systems reviewed and are negative.    Physical Exam Updated Vital Signs BP 133/90 (BP Location: Right Arm)   Pulse 78   Temp 98.7 F (37.1 C) (Oral)   Resp 18   Wt 116.2 kg   LMP 08/31/2015 (Exact Date)   SpO2 100%   BMI 45.37 kg/m   Physical Exam CONSTITUTIONAL: Well developed/well nourished HEAD: Normocephalic/atraumatic EYES: EOMI/PERRL, no nystagmus, no ptosis ENMT: Mucous membranes moist, bilateral TM clear and intact NECK: supple no meningeal signs, no bruits SPINE/BACK:entire spine nontender CV: S1/S2 noted, no murmurs/rubs/gallops noted LUNGS: Lungs are clear to auscultation bilaterally, no apparent distress ABDOMEN: soft, nontender, no rebound or guarding GU:no cva tenderness NEURO:Awake/alert, face symmetric, no arm or leg drift is noted Cranial nerves 3/4/5/6/08/27/08/11/12 tested and intact Gait normal without ataxia No past pointing Sensation to light touch intact in all extremities EXTREMITIES: pulses normal, full ROM SKIN: warm, color normal PSYCH: no  abnormalities of mood noted, alert and oriented to situation    ED Treatments / Results  Labs (all labs ordered are listed, but only abnormal results are displayed) Labs Reviewed - No data to display  EKG  EKG Interpretation None       Radiology Ct Head Wo Contrast  Result Date: 09/15/2015 CLINICAL DATA:  40 year old female with frontal headache. No trauma. EXAM: CT HEAD WITHOUT CONTRAST TECHNIQUE: Contiguous axial images were obtained from the base of the skull through the vertex without intravenous contrast. COMPARISON:  None. FINDINGS: The ventricles and the sulci are appropriate in size for the patient's age. There is no intracranial hemorrhage. No midline shift or mass effect identified. The gray-white matter differentiation is preserved. The visualized paranasal  sinuses and mastoid air cells are well aerated. The calvarium is intact. IMPRESSION: No acute intracranial pathology. Electronically Signed   By: Anner Crete M.D.   On: 09/15/2015 21:46   Procedures Procedures (including critical care time)  Medications Ordered in ED Medications  ibuprofen (ADVIL,MOTRIN) tablet 800 mg (800 mg Oral Given 09/15/15 2049)     Initial Impression / Assessment and Plan / ED Course  I have reviewed the triage vital signs and the nursing notes.    Clinical Course    Pt here without diagnosis of HA disorder present with HA for over 2 weeks that is not improving She has not had any recent neuroimaging and due to persistent HA will order CT head to explore for any mass lesions Pt agreeable 9:51 PM CT head negative Pt well appearing Will refer to HA wellness center   Final Clinical Impressions(s) / ED Diagnoses   Final diagnoses:  New daily persistent headache    New Prescriptions New Prescriptions   No medications on file     Ripley Fraise, MD 09/15/15 2151

## 2015-09-15 NOTE — Discharge Instructions (Signed)

## 2015-10-18 ENCOUNTER — Other Ambulatory Visit: Payer: Self-pay | Admitting: Obstetrics and Gynecology

## 2015-10-20 LAB — CYTOLOGY - PAP

## 2016-06-04 ENCOUNTER — Ambulatory Visit: Payer: Self-pay | Admitting: Obstetrics & Gynecology

## 2016-06-11 ENCOUNTER — Ambulatory Visit (INDEPENDENT_AMBULATORY_CARE_PROVIDER_SITE_OTHER): Payer: 59 | Admitting: Obstetrics & Gynecology

## 2016-06-11 ENCOUNTER — Encounter: Payer: Self-pay | Admitting: Obstetrics & Gynecology

## 2016-06-11 VITALS — BP 148/92 | Ht 65.0 in | Wt 264.4 lb

## 2016-06-11 DIAGNOSIS — D259 Leiomyoma of uterus, unspecified: Secondary | ICD-10-CM | POA: Diagnosis not present

## 2016-06-11 DIAGNOSIS — D649 Anemia, unspecified: Secondary | ICD-10-CM

## 2016-06-11 DIAGNOSIS — N920 Excessive and frequent menstruation with regular cycle: Secondary | ICD-10-CM

## 2016-06-11 DIAGNOSIS — N6311 Unspecified lump in the right breast, upper outer quadrant: Secondary | ICD-10-CM

## 2016-06-11 DIAGNOSIS — Z01411 Encounter for gynecological examination (general) (routine) with abnormal findings: Secondary | ICD-10-CM

## 2016-06-11 NOTE — Progress Notes (Signed)
Katherine Hutchinson [birth year] [id number]   History:    41 y.o. [id number] Single.  New patient presenting for annual gyn exam, heavy menses/Fibroids with secondary anemia and H/O Abnormal Paps x 2 per patient.  Menses q month with very heavy flow.  Last Hb 04/2016 at 9.9.  Pelvic US done recently, will obtain report, per patient, showing Fibroids up to 5 cm.  Pelvic discomfort.  Wants to preserve Fertility.  Breast tenderness/lump Rt upper external quadrant.  Lt Breast normal.   Past medical history,surgical history, family history and social history were all reviewed and documented in the EPIC chart.  Gynecologic History Patient's last menstrual period was 05/30/2016 (approximate). Contraception: none Last Pap: Abnormal per patient 09/2015 (second Abnormal pap in a row, no Colpo done) Last mammogram:  None  Obstetric History OB History  Gravida Para Term Preterm AB Living  6 3 3   2 3   SAB TAB Ectopic Multiple Live Births    2          # Outcome Date GA Lbr Len/2nd Weight Sex Delivery Anes PTL Lv  6 Gravida              Birth Comments: System Generated. Please review and update pregnancy details.  5 TAB           4 TAB           3 Term           2 Term           1 Term                ROS: A ROS was performed and pertinent positives and negatives are included in the history.  GENERAL: No fevers or chills. HEENT: No change in vision, no earache, sore throat or sinus congestion. NECK: No pain or stiffness. CARDIOVASCULAR: No chest pain or pressure. No palpitations. PULMONARY: No shortness of breath, cough or wheeze. GASTROINTESTINAL: No abdominal pain, nausea, vomiting or diarrhea, melena or bright red blood per rectum. GENITOURINARY: No urinary frequency, urgency, hesitancy or dysuria. MUSCULOSKELETAL: No joint or muscle pain, no back pain, no recent trauma. DERMATOLOGIC: No rash, no itching, no lesions. ENDOCRINE: No polyuria, polydipsia, no heat or cold intolerance. No recent change  in weight. HEMATOLOGICAL: No anemia or easy bruising or bleeding. NEUROLOGIC: No headache, seizures, numbness, tingling or weakness. PSYCHIATRIC: No depression, no loss of interest in normal activity or change in sleep pattern.     Exam:   BP (!) 148/92   Ht 5\' 5"  (1.651 m)   Wt 264 lb 6.4 oz (119.9 kg)   LMP 05/30/2016 (Approximate)   BMI 44.00 kg/m   Body mass index is 44 kg/m.  General appearance : Well developed well nourished female. No acute distress HEENT: Eyes: no retinal hemorrhage or exudates,  Neck supple, trachea midline, no carotid bruits, no thyroidmegaly Lungs: Clear to auscultation, no rhonchi or wheezes, or rib retractions  Heart: Regular rate and rhythm, no murmurs or gallops Breast:Examined in sitting and supine position were symmetrical in appearance, no palpable masses or tenderness,  no skin retraction, no nipple inversion, no nipple discharge, no skin discoloration, no axillary or supraclavicular lymphadenopathy Abdomen: no palpable masses or tenderness, no rebound or guarding Extremities: no edema or skin discoloration or tenderness  Pelvic:  Bartholin, Urethra, Skene Glands: Within normal limits             Vagina: No gross lesions or discharge  Cervix: No  gross lesions or discharge  Uterus  A/V, increase size, non-tender and mobile  Adnexa  Without masses or tenderness  Anus and perineum  normal    Assessment/Plan:  41 y.o. female for annual exam.  1. Encounter for gynecological examination with abnormal finding Pap/HR HPV done.  Per patient, 2 last Paps Abnormal.  No Colpo done.  2. Uterine leiomyoma, unspecified location Will obtain recent Pelvic US.  3. Secondary anemia Hb 9.9  4. 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    5. Breast lump on right side at 10 o'clock position Dense, tender area at 10 O'clock.  Schedule Rt Dx Mammo/US.  Counseling >50% x 20 min on above issues.  [name] MD, 3:34 PM 06/11/2016

## 2016-06-11 NOTE — Patient Instructions (Signed)
Your Annual/Gyn exam showed a dense area on your Right Breast for which we are organizing a Diagnostic Rt Mammo and Breast US.  You will receive a phone call to set the appointment.  A Pap/HPV HR was done today.  At F/U next week, a Colposcopy will be performed if your Pap test is Abnormal again (2 previous Abnormal Paps per your info).  We also discussed your heavy periods with secondary Anemia probably because of Uterine Fibroids.  We will Obtain your Medical Records and review your recent Pelvic US next week.  Management will be decided with you per Pelvic US findings. It was a pleasure to meet you today.  See you soon!

## 2016-06-12 ENCOUNTER — Telehealth: Payer: Self-pay | Admitting: *Deleted

## 2016-06-12 DIAGNOSIS — N644 Mastodynia: Secondary | ICD-10-CM

## 2016-06-12 DIAGNOSIS — N63 Unspecified lump in unspecified breast: Secondary | ICD-10-CM

## 2016-06-12 NOTE — Telephone Encounter (Signed)
I spoke with patient and confirmed [facility] placed she had breast imaging done. I told her [facility] will be in touch soon with appt date and time.

## 2016-06-12 NOTE — Telephone Encounter (Signed)
-----   Message from [name], MD sent at 06/11/2016  3:41 PM EDT ----- Schedule Rt Dx Mammo/Breast US for Rt Breast tenderness and lump at 10 O'clock.

## 2016-06-12 NOTE — Telephone Encounter (Signed)
Left message to confirm location at the breast center for scheduling.

## 2016-06-13 ENCOUNTER — Encounter: Payer: Self-pay | Admitting: Obstetrics & Gynecology

## 2016-06-13 LAB — PAP, TP IMAGING W/ HPV RNA, RFLX HPV TYPE 16,18/45: HPV MRNA, HIGH RISK: NOT DETECTED

## 2016-06-13 NOTE — Telephone Encounter (Signed)
Appointment 06/18/16 @ 9:00am, left message for pt to call

## 2016-06-18 ENCOUNTER — Other Ambulatory Visit: Payer: Medicaid Other

## 2016-06-19 NOTE — Telephone Encounter (Signed)
Pt never called back to get time and date, it appears she did not show up at breast center on 06/18/16, I called pt and left a detailed message on her voicemail to call me back to let me know she will contact the breast center to reschedule this appointment.

## 2016-06-21 ENCOUNTER — Ambulatory Visit
Admission: RE | Admit: 2016-06-21 | Discharge: 2016-06-21 | Disposition: A | Discharge status: Home / Self Care | Payer: 59 | Source: Ambulatory Visit | Attending: Obstetrics & Gynecology | Admitting: Obstetrics & Gynecology

## 2016-06-21 DIAGNOSIS — N644 Mastodynia: Secondary | ICD-10-CM

## 2016-06-21 DIAGNOSIS — N63 Unspecified lump in unspecified breast: Secondary | ICD-10-CM

## 2016-06-21 NOTE — Telephone Encounter (Signed)
Pt scheduled breast imaging today

## 2016-06-22 ENCOUNTER — Encounter: Payer: Self-pay | Admitting: Obstetrics & Gynecology

## 2016-06-22 ENCOUNTER — Ambulatory Visit (INDEPENDENT_AMBULATORY_CARE_PROVIDER_SITE_OTHER): Payer: 59 | Admitting: Obstetrics & Gynecology

## 2016-06-22 VITALS — BP 134/86

## 2016-06-22 DIAGNOSIS — D250 Submucous leiomyoma of uterus: Secondary | ICD-10-CM

## 2016-06-22 DIAGNOSIS — D251 Intramural leiomyoma of uterus: Secondary | ICD-10-CM

## 2016-06-22 DIAGNOSIS — N920 Excessive and frequent menstruation with regular cycle: Secondary | ICD-10-CM | POA: Diagnosis not present

## 2016-06-22 DIAGNOSIS — D500 Iron deficiency anemia secondary to blood loss (chronic): Secondary | ICD-10-CM | POA: Diagnosis not present

## 2016-06-22 MED ORDER — NORETHINDRONE 0.35 MG PO TABS: 1.0000 | ORAL_TABLET | Freq: Every day | ORAL | 4 refills | Status: DC

## 2016-06-22 NOTE — Progress Notes (Signed)
    Katherine Hutchinson 1975-08-26 [id number]        41 y.o.  [id number] single.  Desires preservation of Fertility.  RP:  Follow up with Pelvic US result from 09/2015 to discuss Fibroids/Menorrhagia/Secondary Anemia  Pap wnl/HPV HR neg 06/11/2016.    Pelvic US at [facility] 09/2015:  Overall uterine size 575 cc.  13.5 x 8.9 x 9.15 cm.  IM and SS myomas.  Largest 5 Myomas at 4.85 cm, 4.0 cm, 2.9 cm, 2.7 cm and 2.8 cm.  Endometrial line 18.4 mm.  Ovaries normal.  No FF.  Hb 08/2015 at 9.9  Past medical history,surgical history, problem list, medications, allergies, family history and social history were all reviewed and documented in the EPIC chart.  Directed ROS with pertinent positives and negatives documented in the history of present illness/assessment and plan.  Exam:  There were no vitals filed for this visit. General appearance:  Normal   Assessment/Plan:  41 y.o. [id number]   1. Menorrhagia with regular cycle Pelvic US from 09/2015 reviewed thoroughly with patient.  Decision to control her heavy periods with the Progestin-only pill at this point.  Will reassess at f/u with Pelvic US and Hb.  Treatment recommendations will take into account patient's AMA and desire to preserve Fertility.  2. Iron deficiency anemia due to chronic blood loss Continue Iron Sulfate BID.  Repeat Hb with Fam MD in 6 wks at patient's preference.  3. Intramural and submucous leiomyoma of uterus Myomatous uterus with probable Adenomyosis.  Will repeat the Pelvic US at f/u in 8 wks to evaluate the growth and location of the fibroids and the Endometrial line.  Will decide based on the US findings, the Hb result and her response to the Progestin-only pill if any additional treatment is indicated, medical or surgical.  - US Transvaginal Non-OB; Future  Counseling on above issues and treatment options >50% x 15 minutes.  Katherine Bruins MD, 3:30 PM 06/22/2016

## 2016-06-22 NOTE — Patient Instructions (Signed)
1. Menorrhagia with regular cycle Pelvic US from 09/2015 reviewed thoroughly with patient.  Decision to control her heavy periods with the Progestin-only pill at this point.  Prescription sent to pharmacy.  Will reassess at f/u with Pelvic US and Hb.  Treatment recommendations will take into account patient's AMA and desire to preserve Fertility.  2. Iron deficiency anemia due to chronic blood loss Continue Iron Sulfate BID.  Repeat Hb with Fam MD in 6 wks at patient's preference.  3. Intramural and submucous leiomyoma of uterus Myomatous uterus with probable Adenomyosis.  Will repeat the Pelvic US at f/u in 8 wks to evaluate the growth and location of the fibroids and the Endometrial line.  Will decide based on the US findings, the Hb result and her response to the Progestin-only pill if any additional treatment is indicated, medical or surgical.  - US Transvaginal Non-OB in 8 wks.  See you soon.

## 2017-01-03 ENCOUNTER — Other Ambulatory Visit: Payer: 59

## 2017-01-03 ENCOUNTER — Ambulatory Visit: Payer: 59 | Admitting: Obstetrics & Gynecology

## 2017-03-15 ENCOUNTER — Ambulatory Visit: Payer: 59 | Admitting: Physician Assistant

## 2017-03-18 ENCOUNTER — Ambulatory Visit: Payer: BLUE CROSS/BLUE SHIELD | Admitting: Obstetrics & Gynecology

## 2017-03-18 ENCOUNTER — Other Ambulatory Visit (HOSPITAL_COMMUNITY): Payer: Self-pay | Admitting: Specialist

## 2017-03-18 ENCOUNTER — Ambulatory Visit (HOSPITAL_COMMUNITY)
Admission: RE | Admit: 2017-03-18 | Discharge: 2017-03-18 | Disposition: A | Discharge status: Home / Self Care | Payer: BLUE CROSS/BLUE SHIELD | Source: Ambulatory Visit | Attending: Surgery | Admitting: Surgery

## 2017-03-18 ENCOUNTER — Other Ambulatory Visit: Payer: 59

## 2017-03-18 DIAGNOSIS — M79604 Pain in right leg: Secondary | ICD-10-CM

## 2017-03-18 DIAGNOSIS — M79605 Pain in left leg: Secondary | ICD-10-CM

## 2017-03-29 ENCOUNTER — Ambulatory Visit: Payer: 59 | Admitting: Physician Assistant

## 2017-04-03 ENCOUNTER — Other Ambulatory Visit: Payer: BLUE CROSS/BLUE SHIELD

## 2017-04-03 ENCOUNTER — Ambulatory Visit: Payer: Self-pay | Admitting: Obstetrics & Gynecology

## 2017-04-03 DIAGNOSIS — Z0289 Encounter for other administrative examinations: Secondary | ICD-10-CM

## 2017-04-05 ENCOUNTER — Ambulatory Visit: Payer: BLUE CROSS/BLUE SHIELD | Admitting: Physician Assistant

## 2017-05-16 ENCOUNTER — Other Ambulatory Visit: Payer: Self-pay

## 2017-05-16 ENCOUNTER — Encounter (HOSPITAL_COMMUNITY): Payer: Self-pay

## 2017-05-16 ENCOUNTER — Emergency Department (HOSPITAL_COMMUNITY): Payer: BLUE CROSS/BLUE SHIELD

## 2017-05-16 ENCOUNTER — Emergency Department (HOSPITAL_COMMUNITY)
Admission: EM | Admit: 2017-05-16 | Discharge: 2017-05-16 | Disposition: A | Discharge status: Home / Self Care | Payer: BLUE CROSS/BLUE SHIELD | Attending: Emergency Medicine | Admitting: Emergency Medicine

## 2017-05-16 DIAGNOSIS — D251 Intramural leiomyoma of uterus: Secondary | ICD-10-CM | POA: Diagnosis not present

## 2017-05-16 DIAGNOSIS — N83202 Unspecified ovarian cyst, left side: Secondary | ICD-10-CM | POA: Insufficient documentation

## 2017-05-16 DIAGNOSIS — R1032 Left lower quadrant pain: Secondary | ICD-10-CM | POA: Diagnosis present

## 2017-05-16 LAB — URINALYSIS, ROUTINE W REFLEX MICROSCOPIC
BILIRUBIN URINE: NEGATIVE
GLUCOSE, UA: NEGATIVE mg/dL
KETONES UR: NEGATIVE mg/dL
Leukocytes, UA: NEGATIVE
Nitrite: NEGATIVE
PROTEIN: NEGATIVE mg/dL
Specific Gravity, Urine: 1.011 (ref 1.005–1.030)
pH: 8 (ref 5.0–8.0)

## 2017-05-16 LAB — COMPREHENSIVE METABOLIC PANEL
ALT: 13 U/L — ABNORMAL LOW (ref 14–54)
ANION GAP: 10 (ref 5–15)
AST: 13 U/L — ABNORMAL LOW (ref 15–41)
Albumin: 3.6 g/dL (ref 3.5–5.0)
Alkaline Phosphatase: 50 U/L (ref 38–126)
BUN: 12 mg/dL (ref 6–20)
CHLORIDE: 106 mmol/L (ref 101–111)
CO2: 24 mmol/L (ref 22–32)
Calcium: 9.1 mg/dL (ref 8.9–10.3)
Creatinine, Ser: 0.91 mg/dL (ref 0.44–1.00)
GFR calc non Af Amer: >60 mL/min (ref >60)
Glucose, Bld: 105 mg/dL — ABNORMAL HIGH (ref 65–99)
POTASSIUM: 3.7 mmol/L (ref 3.5–5.1)
Sodium: 140 mmol/L (ref 135–145)
Total Bilirubin: 0.6 mg/dL (ref 0.3–1.2)
Total Protein: 7.3 g/dL (ref 6.5–8.1)

## 2017-05-16 LAB — CBC
HCT: 32.8 % — ABNORMAL LOW (ref 36.0–46.0)
HEMOGLOBIN: 9.7 g/dL — AB (ref 12.0–15.0)
MCH: 24 pg — AB (ref 26.0–34.0)
MCHC: 29.6 g/dL — ABNORMAL LOW (ref 30.0–36.0)
MCV: 81.2 fL (ref 78.0–100.0)
Platelets: 303 10*3/uL (ref 150–400)
RBC: 4.04 MIL/uL (ref 3.87–5.11)
RDW: 16.8 % — ABNORMAL HIGH (ref 11.5–15.5)
WBC: 6.2 10*3/uL (ref 4.0–10.5)

## 2017-05-16 LAB — I-STAT BETA HCG BLOOD, ED (MC, WL, AP ONLY): I-stat hCG, quantitative: <5 m[IU]/mL (ref <5)

## 2017-05-16 LAB — LIPASE, BLOOD: LIPASE: 31 U/L (ref 11–51)

## 2017-05-16 MED ORDER — IOPAMIDOL (ISOVUE-300) INJECTION 61%
100.0000 mL | Freq: Once | INTRAVENOUS | Status: AC | PRN
  Administered 2017-05-16: 100 mL via INTRAVENOUS

## 2017-05-16 MED ORDER — IBUPROFEN 800 MG PO TABS
800.0000 mg | ORAL_TABLET | Freq: Once | ORAL | Status: AC
  Administered 2017-05-16: 800 mg via ORAL
  Filled 2017-05-16: qty 1

## 2017-05-16 MED ORDER — IOPAMIDOL (ISOVUE-300) INJECTION 61%
INTRAVENOUS | Status: AC
  Filled 2017-05-16: qty 100

## 2017-05-16 NOTE — ED Notes (Signed)
Pt had drawn for labs:  Red Gold Blue Lavender Lt green Dark green x2

## 2017-05-16 NOTE — ED Notes (Signed)
Pt is aware that a urine sample is needed. Pt had just recently voided her bladder. Pt will try again.

## 2017-05-16 NOTE — ED Triage Notes (Signed)
Patient c/o left lower abdominal pain x 6 days.

## 2017-05-16 NOTE — ED Provider Notes (Signed)
[facility] DEPT Provider Note   CSN: [id number] Arrival date & time: 05/16/17  6295     History   Chief Complaint Chief Complaint  Patient presents with  . Abdominal Pain    HPI Katherine Hutchinson is a 42 y.o. female with history of anemia, uterine fibroids, and obesity presents today for evaluation of acute onset, progressively worsening left lower quadrant abdominal pain for 6 days.  She states that she typically notices similar pain during ovulation but states this pain has been persistent and more severe.  Pain is sharp, stabbing, cramping, and aching in nature and radiates to the left side of the back.  No aggravating factors noted, pain is somewhat alleviated with Motrin.  She denies vaginal itching, bleeding, or discharge.  She denies nausea, vomiting, melena, hematochezia, or urinary symptoms.  No hematuria.  She denies chest pain or shortness of breath.  She is currently sexually active with one female partner but does not always use protection.   The history is provided by the patient.    Past Medical History:  Diagnosis Date  . Anemia   . Chlamydia   . Fibroids    uterine  . FIBROIDS, UTERUS 06/04/2008   Qualifier: Diagnosis of  By: [name], [name]    . Genital warts 03/06/2011  . H/O right breast biopsy    benign  . History of blood transfusion    after d&c  . Morbid obesity ([place])   . Obesity   . Uterus, adenomyosis 01/17/2011   Suggested by transvaginal US in 09/2010     Patient Active Problem List   Diagnosis Date Noted  . Dysphagia, pharyngoesophageal phase 12/31/2013  . Hematochezia 01/28/2013  . Chest pain 09/25/2012  . Anxiety state, unspecified 09/25/2012  . SOB (shortness of breath) 09/18/2012  . GERD (gastroesophageal reflux disease) 09/18/2012  . Macular rash 11/07/2011  . Drug rash 06/14/2011  . Irregular periods 04/04/2011  . Menorrhagia with regular cycle 11/09/2010  . Chronic back pain 11/09/2010  . Preventative  health care 11/09/2010  . Obesity 11/09/2010  . Microcytic anemia 05/11/2008    Past Surgical History:  Procedure Laterality Date  . BREAST EXCISIONAL BIOPSY Right 03/2004  . BREAST LUMPECTOMY     benign  . DILATION AND CURETTAGE, DIAGNOSTIC / THERAPEUTIC    . DILATION AND EVACUATION  04/19/2011   Procedure: DILATATION AND EVACUATION;  Surgeon: Guss Bunde, MD;  Location: [facility];  Service: Gynecology;  Laterality: N/A;  . TOOTH EXTRACTION  02/22/2014     OB History    Gravida  6   Para  3   Term  3   Preterm      AB  2   Living  3     SAB      TAB  2   Ectopic      Multiple      Live Births               Home Medications    Prior to Admission medications   Medication Sig Start Date End Date Taking? Authorizing Provider  ibuprofen (ADVIL,MOTRIN) 800 MG tablet Take 1 tablet (800 mg total) by mouth every 8 (eight) hours as needed. 10/12/14  Yes Lawyer, Harrell Gave, PA-C  norethindrone (ORTHO MICRONOR) 0.35 MG tablet Take 1 tablet (0.35 mg total) by mouth daily. Patient not taking: Reported on 05/16/2017 06/22/16   [name], MD    Family History Family History  Problem Relation Age of  Onset  . Hypertension Mother   . Colon polyps Mother   . Hypertension Sister   . Heart disease Brother   . Hypertension Father   . Stroke Maternal Aunt   . Hypertension Maternal Grandmother   . Colon cancer Maternal Grandfather   . Anesthesia problems Neg Hx   . Stomach cancer Neg Hx     Social History Social History   Tobacco Use  . Smoking status: Never Smoker  . Smokeless tobacco: Never Used  Substance Use Topics  . Alcohol use: No  . Drug use: No    Comment: Pt admits to h/o marijuana use     Allergies   Shellfish-derived products   Review of Systems Review of Systems  Constitutional: Negative for chills and fever.  Respiratory: Negative for shortness of breath.   Cardiovascular: Negative for chest pain.  Gastrointestinal: Positive for  abdominal pain. Negative for constipation, diarrhea, nausea and vomiting.  Genitourinary: Negative for decreased urine volume, dysuria, flank pain, frequency, hematuria, urgency, vaginal bleeding, vaginal discharge and vaginal pain.  Musculoskeletal: Positive for back pain.  All other systems reviewed and are negative.    Physical Exam Updated Vital Signs BP 123/70 (BP Location: Left Arm)   Pulse (!) 50   Temp 98.1 F (36.7 C) (Oral)   Resp 17   Ht 5\' 5"  (1.651 m)   Wt 113.4 kg (250 lb)   LMP 04/23/2017   SpO2 100%   BMI 41.60 kg/m    Physical Exam  Constitutional: She appears well-developed and well-nourished. No distress.  HENT:  Head: Normocephalic and atraumatic.  Eyes: Conjunctivae are normal. Right eye exhibits no discharge. Left eye exhibits no discharge.  Neck: No JVD present. No tracheal deviation present.  Cardiovascular: Normal rate, regular rhythm and normal heart sounds.  Pulmonary/Chest: Effort normal and breath sounds normal.  Abdominal: Soft. Bowel sounds are normal. She exhibits no distension. There is tenderness in the suprapubic area and left lower quadrant. There is no rigidity, no rebound, no guarding, no CVA tenderness, no tenderness at McBurney's point and negative Murphy's sign.  Maximally tender in the left lower quadrant.  Abdomen is soft and obese  Musculoskeletal: She exhibits no edema.  No midline spine TTP, mild left paralumbar muscle tenderness, no deformity, crepitus, or step-off noted   Neurological: She is alert.  Skin: Skin is warm and dry. No erythema.  Psychiatric: She has a normal mood and affect. Her behavior is normal.  Nursing note and vitals reviewed.    ED Treatments / Results  Labs (all labs ordered are listed, but only abnormal results are displayed) Labs Reviewed  COMPREHENSIVE METABOLIC PANEL - Abnormal; Notable for the following components:      Result Value   Glucose, Bld 105 (*)    AST 13 (*)    ALT 13 (*)    All  other components within normal limits  CBC - Abnormal; Notable for the following components:   Hemoglobin 9.7 (*)    HCT 32.8 (*)    MCH 24.0 (*)    MCHC 29.6 (*)    RDW 16.8 (*)    All other components within normal limits  URINALYSIS, ROUTINE W REFLEX MICROSCOPIC - Abnormal; Notable for the following components:   Hgb urine dipstick SMALL (*)    Bacteria, UA RARE (*)    Squamous Epithelial / LPF 0-5 (*)    All other components within normal limits  LIPASE, BLOOD  I-STAT BETA HCG BLOOD, ED ([facility], [facility], [facility] ONLY)  EKG None  Radiology US Transvaginal Non-ob  Result Date: 05/16/2017 CLINICAL DATA:  Pelvic pain EXAM: TRANSABDOMINAL AND TRANSVAGINAL ULTRASOUND OF PELVIS DOPPLER ULTRASOUND OF OVARIES TECHNIQUE: Study was performed transabdominally to optimize pelvic field of view evaluation and transvaginally to optimize internal visceral architecture evaluation. Color and duplex Doppler ultrasound was utilized to evaluate blood flow to the ovaries. COMPARISON:  None. FINDINGS: Uterus Measurements: 15.9 x 9.0 x 10.2 cm. The echotexture of the uterus is diffusely inhomogeneous. There is a mass in the leftward aspect of the uterine fundus measuring 3.9 x 3.1 x 4.0 cm. A mass arising along the rightward aspect of the endometrium measures 3.7 x 2.3 x 2.4 cm. A mass in the rightward uterine fundus more superiorly measures 5.9 x 3.3 x 4.2 cm. A mass in the left superior fundus measures 6.0 x 4.5 x 7.3 cm. Other smaller masses are evident as well. Endometrium Thickness: 15 mm.  No focal abnormality visualized. Right ovary Unable to visualize by either transabdominal or transvaginal technique. No right-sided pelvic mass evident. Left ovary Measurements: 4.1 x 2.4 x 3.0 cm. There is a complex cystic area within the left ovary measuring 1.5 x 1.8 x 1.5 cm. No other left-sided pelvic mass. Pulsed Doppler evaluation of the left ovary demonstrates normal low-resistance arterial and venous waveforms. Right ovary  could not be visualized. Other findings No abnormal free fluid. IMPRESSION: 1. Enlarged diffusely leiomyomatous uterus with multiple well-defined masses as well as diffuse inhomogeneity of the echotexture of the uterus consistent with diffuse leiomyomatous change. 2. Endometrial thickness upper normal. No endometrial lesion demonstrated. 3. Probable small hemorrhagic cyst left ovary measuring 1.5 x 1.8 x 1.5 cm. Short-interval follow up ultrasound in 6-12 weeks is recommended, preferably during the week following the patient's normal menses. Left ovary otherwise appears normal. No left ovarian torsion. 4. Unable to visualize right ovary. No right-sided pelvic mass evident. 5.   No free pelvic fluid. Electronically Signed   By: Lowella Grip III M.D.   On: 05/16/2017 16:18   US Pelvis Complete  Result Date: 05/16/2017 CLINICAL DATA:  Pelvic pain EXAM: TRANSABDOMINAL AND TRANSVAGINAL ULTRASOUND OF PELVIS DOPPLER ULTRASOUND OF OVARIES TECHNIQUE: Study was performed transabdominally to optimize pelvic field of view evaluation and transvaginally to optimize internal visceral architecture evaluation. Color and duplex Doppler ultrasound was utilized to evaluate blood flow to the ovaries. COMPARISON:  None. FINDINGS: Uterus Measurements: 15.9 x 9.0 x 10.2 cm. The echotexture of the uterus is diffusely inhomogeneous. There is a mass in the leftward aspect of the uterine fundus measuring 3.9 x 3.1 x 4.0 cm. A mass arising along the rightward aspect of the endometrium measures 3.7 x 2.3 x 2.4 cm. A mass in the rightward uterine fundus more superiorly measures 5.9 x 3.3 x 4.2 cm. A mass in the left superior fundus measures 6.0 x 4.5 x 7.3 cm. Other smaller masses are evident as well. Endometrium Thickness: 15 mm.  No focal abnormality visualized. Right ovary Unable to visualize by either transabdominal or transvaginal technique. No right-sided pelvic mass evident. Left ovary Measurements: 4.1 x 2.4 x 3.0 cm. There is a  complex cystic area within the left ovary measuring 1.5 x 1.8 x 1.5 cm. No other left-sided pelvic mass. Pulsed Doppler evaluation of the left ovary demonstrates normal low-resistance arterial and venous waveforms. Right ovary could not be visualized. Other findings No abnormal free fluid. IMPRESSION: 1. Enlarged diffusely leiomyomatous uterus with multiple well-defined masses as well as diffuse inhomogeneity of the echotexture of  the uterus consistent with diffuse leiomyomatous change. 2. Endometrial thickness upper normal. No endometrial lesion demonstrated. 3. Probable small hemorrhagic cyst left ovary measuring 1.5 x 1.8 x 1.5 cm. Short-interval follow up ultrasound in 6-12 weeks is recommended, preferably during the week following the patient's normal menses. Left ovary otherwise appears normal. No left ovarian torsion. 4. Unable to visualize right ovary. No right-sided pelvic mass evident. 5.   No free pelvic fluid. Electronically Signed   By: Lowella Grip III M.D.   On: 05/16/2017 16:18   Ct Abdomen Pelvis W Contrast  Result Date: 05/16/2017 CLINICAL DATA:  Left lower abdominal pain for the past week. EXAM: CT ABDOMEN AND PELVIS WITH CONTRAST TECHNIQUE: Multidetector CT imaging of the abdomen and pelvis was performed using the standard protocol following bolus administration of intravenous contrast. CONTRAST:  134mL ISOVUE-300 IOPAMIDOL (ISOVUE-300) INJECTION 61% COMPARISON:  CT abdomen pelvis dated Jul 11, 2004. FINDINGS: Lower chest: No acute abnormality. Hepatobiliary: Interval increase in size of a simple cyst in the right lobe of the liver, measuring up to 2.1 cm. Unchanged 1.6 cm simple cyst in the left lobe of the liver. Gallbladder is unremarkable. No biliary dilatation. Pancreas: Unremarkable. No pancreatic ductal dilatation or surrounding inflammatory changes. Spleen: Normal in size without focal abnormality. Adrenals/Urinary Tract: The adrenal glands are unremarkable. Interval increase in  size of innumerable simple appearing bilateral renal cysts, measuring up to 5.3 cm. Other subcentimeter low-density lesions in both kidneys are too small to characterize. No renal or ureteral calculi. No hydronephrosis. The bladder is unremarkable for the degree of distention. Stomach/Bowel: Stomach is within normal limits. Appendix appears normal. No evidence of bowel wall thickening, distention, or inflammatory changes. Vascular/Lymphatic: No significant vascular findings are present. No enlarged abdominal or pelvic lymph nodes. Reproductive: Enlarged, fibroid uterus.  No adnexal mass. Other: No abdominal wall hernia or abnormality. No abdominopelvic ascites. No pneumoperitoneum. Musculoskeletal: No acute or significant osseous findings. IMPRESSION: 1.  No acute intra-abdominal process. 2. Enlarged, fibroid uterus, which could account for the patient's symptoms. 3. Interval increase in size of innumerable simple appearing bilateral renal cysts. This may reflect autosomal dominant polycystic kidney disease. Electronically Signed   By: Titus Dubin M.D.   On: 05/16/2017 13:59   [place] Art/ven Flow Abd Pelv Doppler  Result Date: 05/16/2017 CLINICAL DATA:  Pelvic pain EXAM: TRANSABDOMINAL AND TRANSVAGINAL ULTRASOUND OF PELVIS DOPPLER ULTRASOUND OF OVARIES TECHNIQUE: Study was performed transabdominally to optimize pelvic field of view evaluation and transvaginally to optimize internal visceral architecture evaluation. Color and duplex Doppler ultrasound was utilized to evaluate blood flow to the ovaries. COMPARISON:  None. FINDINGS: Uterus Measurements: 15.9 x 9.0 x 10.2 cm. The echotexture of the uterus is diffusely inhomogeneous. There is a mass in the leftward aspect of the uterine fundus measuring 3.9 x 3.1 x 4.0 cm. A mass arising along the rightward aspect of the endometrium measures 3.7 x 2.3 x 2.4 cm. A mass in the rightward uterine fundus more superiorly measures 5.9 x 3.3 x 4.2 cm. A mass in the left  superior fundus measures 6.0 x 4.5 x 7.3 cm. Other smaller masses are evident as well. Endometrium Thickness: 15 mm.  No focal abnormality visualized. Right ovary Unable to visualize by either transabdominal or transvaginal technique. No right-sided pelvic mass evident. Left ovary Measurements: 4.1 x 2.4 x 3.0 cm. There is a complex cystic area within the left ovary measuring 1.5 x 1.8 x 1.5 cm. No other left-sided pelvic mass. Pulsed Doppler evaluation of the  left ovary demonstrates normal low-resistance arterial and venous waveforms. Right ovary could not be visualized. Other findings No abnormal free fluid. IMPRESSION: 1. Enlarged diffusely leiomyomatous uterus with multiple well-defined masses as well as diffuse inhomogeneity of the echotexture of the uterus consistent with diffuse leiomyomatous change. 2. Endometrial thickness upper normal. No endometrial lesion demonstrated. 3. Probable small hemorrhagic cyst left ovary measuring 1.5 x 1.8 x 1.5 cm. Short-interval follow up ultrasound in 6-12 weeks is recommended, preferably during the week following the patient's normal menses. Left ovary otherwise appears normal. No left ovarian torsion. 4. Unable to visualize right ovary. No right-sided pelvic mass evident. 5.   No free pelvic fluid. Electronically Signed   By: Lowella Grip III M.D.   On: 05/16/2017 16:18    Procedures Procedures (including critical care time)  Medications Ordered in ED Medications  iopamidol (ISOVUE-300) 61 % injection (has no administration in time range)  iopamidol (ISOVUE-300) 61 % injection 100 mL (100 mLs Intravenous Contrast Given 05/16/17 1335)  ibuprofen (ADVIL,MOTRIN) tablet 800 mg (800 mg Oral Given 05/16/17 1530)     Initial Impression / Assessment and Plan / ED Course  I have reviewed the triage vital signs and the nursing notes.  Pertinent labs & imaging results that were available during my care of the patient were reviewed by me and considered in my  medical decision making (see chart for details).     Patient presents for evaluation of left lower quadrant abdominal pain.  She is afebrile, somewhat hypertensive initially with significant improvement while in the ED.  Suspect this is likely in response to pain.  On examination, patient has mild pain on palpation of the left lower quadrant region with mild suprapubic tenderness as well. Lab work reviewed by me shows no leukocytosis, stable anemia as compared to prior lab work, no elevation in LFTs, creatinine, or lipase.  Electrolytes are within normal limits.  UA is not concerning for UTI or nephrolithiasis.  Patient is not pregnant.  Patient declined pelvic examination today and I explained to her that she is risking an incomplete workup and possible missed diagnosis as a result.  She states that she understands the risk associated with foregoing pelvic exam today and states that if required she will follow-up with her OB/GYN for one.  CT of the abdomen and pelvis shows no acute intra-abdominal processes but does show an enlarged fibroid uterus.  Patient has known about this for several years at this time.  CT also shows an increase in the size of simple appearing bilateral renal cysts which may reflect autosomal dominant polycystic kidney disease.  Her kidney function is stable today which I informed the patient and recommended follow-up with a primary care physician for reevaluation.  Pelvic ultrasound shows findings consistent with uterine leiomyoma as well as a probable hemorrhagic cyst on the left ovary measuring 1.5 x 1.8 x 1.5 cm.  Radiology recommends follow-up ultrasound in 6-12 weeks.  On reevaluation, patient is resting comfortably in no apparent distress.  She has improved after Motrin.  Repeat abdominal examinations unremarkable.  Abdomen remains benign.  I doubt obstruction, perforation, appendicitis, colitis, or other acute surgical abdominal pathology.  She will follow-up with her OB/GYN for  reevaluation and discussion of her workup today.  Discussed indications for return to the ED. Pt verbalized understanding of and agreement with plan and is safe for discharge home at this time. No complaints prior to discharge.   Final Clinical Impressions(s) / ED Diagnoses   Final  diagnoses:  Cyst of left ovary  Intramural leiomyoma of uterus    ED Discharge Orders    None      Debroah Baller 05/16/17 Tamera Reason, MD 05/24/17 1213

## 2017-05-16 NOTE — Discharge Instructions (Signed)
Alternate 600 mg of ibuprofen and [phone] mg of Tylenol every 3 hours as needed for pain. Do not exceed 4000 mg of Tylenol daily.  Apply heating pad 20 minutes on 20 minutes off as needed for comfort.  Follow-up with your OB/GYN for reevaluation of symptoms and for repeat ultrasound in 6-12 weeks, preferably after your period.  Return to the emergency department if any concerning signs or symptoms develop such as fevers, persistent worsening pain, or persistent nausea and vomiting.

## 2017-08-30 ENCOUNTER — Ambulatory Visit: Payer: BLUE CROSS/BLUE SHIELD | Admitting: Physician Assistant

## 2017-09-04 ENCOUNTER — Other Ambulatory Visit: Payer: Self-pay | Admitting: Orthopedic Surgery

## 2017-09-04 DIAGNOSIS — M542 Cervicalgia: Secondary | ICD-10-CM

## 2017-09-06 ENCOUNTER — Ambulatory Visit: Payer: BLUE CROSS/BLUE SHIELD | Admitting: Physician Assistant

## 2017-09-13 ENCOUNTER — Ambulatory Visit
Admission: RE | Admit: 2017-09-13 | Discharge: 2017-09-13 | Disposition: A | Discharge status: Home / Self Care | Payer: BLUE CROSS/BLUE SHIELD | Source: Ambulatory Visit | Attending: Orthopedic Surgery | Admitting: Orthopedic Surgery

## 2017-09-13 DIAGNOSIS — M542 Cervicalgia: Secondary | ICD-10-CM

## 2017-09-18 ENCOUNTER — Ambulatory Visit
Admission: RE | Admit: 2017-09-18 | Discharge: 2017-09-18 | Disposition: A | Discharge status: Home / Self Care | Payer: BLUE CROSS/BLUE SHIELD | Source: Ambulatory Visit | Attending: Orthopedic Surgery | Admitting: Orthopedic Surgery

## 2017-10-07 ENCOUNTER — Telehealth: Payer: Self-pay | Admitting: *Deleted

## 2017-10-07 DIAGNOSIS — D259 Leiomyoma of uterus, unspecified: Secondary | ICD-10-CM

## 2017-10-07 NOTE — Telephone Encounter (Signed)
Left detailed message to call back and schedule ultrasound appointment.

## 2017-10-07 NOTE — Telephone Encounter (Signed)
Yes, let's do a f/u Pelvic US with the office visit.

## 2017-10-07 NOTE — Telephone Encounter (Signed)
Order placed for ultrasound, claudia will you call and schedule this at next available slot.

## 2017-10-07 NOTE — Telephone Encounter (Signed)
Patient was seen at ER on 05/16/17 due to left ovary cyst. Had CT scan and abdominal ultrasound done that day. Ct scan noted fibroid uterus. Patient wanted to follow up with office visit, but asked if you wanted to schedule any other imaging? Please advise

## 2017-12-26 ENCOUNTER — Ambulatory Visit (INDEPENDENT_AMBULATORY_CARE_PROVIDER_SITE_OTHER): Payer: BLUE CROSS/BLUE SHIELD | Admitting: Obstetrics & Gynecology

## 2017-12-26 ENCOUNTER — Other Ambulatory Visit: Payer: Self-pay | Admitting: Obstetrics & Gynecology

## 2017-12-26 ENCOUNTER — Ambulatory Visit (INDEPENDENT_AMBULATORY_CARE_PROVIDER_SITE_OTHER): Payer: BLUE CROSS/BLUE SHIELD

## 2017-12-26 ENCOUNTER — Encounter: Payer: Self-pay | Admitting: Obstetrics & Gynecology

## 2017-12-26 VITALS — BP 124/80

## 2017-12-26 DIAGNOSIS — D259 Leiomyoma of uterus, unspecified: Secondary | ICD-10-CM | POA: Diagnosis not present

## 2017-12-26 DIAGNOSIS — R1900 Intra-abdominal and pelvic swelling, mass and lump, unspecified site: Secondary | ICD-10-CM

## 2017-12-26 DIAGNOSIS — N898 Other specified noninflammatory disorders of vagina: Secondary | ICD-10-CM

## 2017-12-26 DIAGNOSIS — R9389 Abnormal findings on diagnostic imaging of other specified body structures: Secondary | ICD-10-CM

## 2017-12-26 DIAGNOSIS — N920 Excessive and frequent menstruation with regular cycle: Secondary | ICD-10-CM | POA: Diagnosis not present

## 2017-12-26 DIAGNOSIS — N852 Hypertrophy of uterus: Secondary | ICD-10-CM | POA: Diagnosis not present

## 2017-12-26 DIAGNOSIS — D251 Intramural leiomyoma of uterus: Secondary | ICD-10-CM | POA: Diagnosis not present

## 2017-12-26 DIAGNOSIS — D252 Subserosal leiomyoma of uterus: Secondary | ICD-10-CM

## 2017-12-26 DIAGNOSIS — Z113 Encounter for screening for infections with a predominantly sexual mode of transmission: Secondary | ICD-10-CM

## 2017-12-26 DIAGNOSIS — D219 Benign neoplasm of connective and other soft tissue, unspecified: Secondary | ICD-10-CM

## 2017-12-26 LAB — CBC
HEMATOCRIT: 30.1 % — AB (ref 35.0–45.0)
HEMOGLOBIN: 9 g/dL — AB (ref 11.7–15.5)
MCH: 23 pg — AB (ref 27.0–33.0)
MCHC: 29.9 g/dL — ABNORMAL LOW (ref 32.0–36.0)
MCV: 77 fL — ABNORMAL LOW (ref 80.0–100.0)
MPV: 11.3 fL (ref 7.5–12.5)
Platelets: 276 10*3/uL (ref 140–400)
RBC: 3.91 10*6/uL (ref 3.80–5.10)
RDW: 16.4 % — ABNORMAL HIGH (ref 11.0–15.0)
WBC: 5.4 10*3/uL (ref 3.8–10.8)

## 2017-12-26 LAB — WET PREP FOR TRICH, YEAST, CLUE

## 2017-12-26 MED ORDER — TINIDAZOLE 500 MG PO TABS: 2.0000 g | ORAL_TABLET | Freq: Every day | ORAL | 0 refills | Status: AC

## 2017-12-26 NOTE — Progress Notes (Signed)
Katherine Hutchinson [birth year] [id number]        42 y.o.  [id number] Single  RP: Large Fibroids with Menorrhagia and secondary anemia for Pelvic US  HPI: Evaluated in May 2018 for heavy menses and large uterine fibroids.  Was supposed to come right back for a pelvic ultrasound but per patient insurance situation did not allow.  Continued heavy periods every month.  No breakthrough bleeding.  Feels pelvic pressure and some discomfort but no severe pain.  Would like to preserve fertility potential until 30.  Last hemoglobin was 9.7 in March 2019.  Long history of secondary anemia.   OB History  Gravida Para Term Preterm AB Living  6 3 3   2 3   SAB TAB Ectopic Multiple Live Births    2          # Outcome Date GA Lbr Len/2nd Weight Sex Delivery Anes PTL Lv  6 Gravida              Birth Comments: System Generated. Please review and update pregnancy details.  5 TAB           4 TAB           3 Term           2 Term           1 Term             Past medical history,surgical history, problem list, medications, allergies, family history and social history were all reviewed and documented in the EPIC chart.   Directed ROS with pertinent positives and negatives documented in the history of present illness/assessment and plan.  Exam:  Vitals:   12/26/17 0946  BP: 124/80   General appearance:  Normal  Pelvic US today: T/V and T/a images.  Anteverted uterus measuring 15.62 x 11.54 cm.  Intramural, submucosal and subserous fibroids measured at 5.7 x 3.9 cm, 3.1 x 2.4 cm, 8.0 x 5.7 cm, 4.7 x 4.8 cm, 3.7 x 2.1 cm, the endometrium is displaced by intramural versus submucosal fibroids.  Thick endometrial line at 15.4 mm. Right ovary with a cyst measuring 1.9 x 2.1 cm with internal echoes and positive color flow Doppler at the periphery compatible with a corpus luteum.  Left ovary normal.  No free fluid in the posterior cul-de-sac. Note:   Right upper quadrant cyst measuring 6 cm is seen, patient has  a recent diagnosis of polycystic kidney, at least on the right.  Referred to Nephrologist.  Gyn exam:  Vulva normal.  Speculum:  Cervix normal.  Vagina normal.  Increased vaginal discharge.  Wet prep done.  Wet prep:  Clue cells present   Assessment/Plan:  42 y.o. [id number]   1. Menorrhagia with regular cycle Long-standing menorrhagia with secondary anemia on uterine fibroids.  Mild increase in uterine fibroid size since 2017, normal flow growth.  Thickened endometrium with possible submucosal fibroids.  Will need further investigation, sonohysterogram scheduled.  In the meantime recommend iron sulfate supplements and iron rich nutrition.  Will recheck hemoglobin today to evaluate level of anemia.  Possible need for a hysteroscopy, excision of lesions and D&C.  If no intrauterine lesion, patient informed that we may proceed with an endometrial biopsy at the end of the sonohysterogram. - CBC - [place] Sonohysterogram; Future  2. Fibroids Large uterine fibroids probably contributing to the menorrhagia and secondary anemia.  Patient wants to preserve her fertility until age 42.  Depo-Lupron treatment discussed with patient.  Will first proceed with sonohysterogram and then decide on longer term therapy per results. - [place] Sonohysterogram; Future  3. Thickened endometrium Follow-up sonohysterogram to rule out endometrial polyps, submucosal myoma, endometrial hyperplasia and endometrial cancer. - [place] Sonohysterogram; Future  4. Vaginal odor Bacterial Vaginosis confirmed by Wet Prep.  Will treat with tinidazole.  Usage reviewed and prescription sent to pharmacy. - WET PREP FOR [place], YEAST, CLUE  5. Screen for STD (sexually transmitted disease) - HIV antibody (with reflex) - RPR - Hepatitis C Antibody - Hepatitis B Surface AntiGEN  Other orders - tinidazole (TINDAMAX) 500 MG tablet; Take 4 tablets (2,000 mg total) by mouth daily for 2 days.  Counseling on above issues and coordination of care more  than 50% for 25 minutes.  [name] MD, 10:10 AM 12/26/2017

## 2017-12-26 NOTE — Patient Instructions (Signed)
1. Menorrhagia with regular cycle Long-standing menorrhagia with secondary anemia on uterine fibroids.  Mild increase in uterine fibroid size since 2017, normal flow growth.  Thickened endometrium with possible submucosal fibroids.  Will need further investigation, sonohysterogram scheduled.  In the meantime recommend iron sulfate supplements and iron rich nutrition.  Will recheck hemoglobin today to evaluate level of anemia.  Possible need for a hysteroscopy, excision of lesions and D&C.  If no intrauterine lesion, patient informed that we may proceed with an endometrial biopsy at the end of the sonohysterogram. - CBC - [place] Sonohysterogram; Future  2. Fibroids Large uterine fibroids probably contributing to the menorrhagia and secondary anemia.  Patient wants to preserve her fertility until age 89.  Depo-Lupron treatment discussed with patient.  Will first proceed with sonohysterogram and then decide on longer term therapy per results. - [place] Sonohysterogram; Future  3. Thickened endometrium Follow-up sonohysterogram to rule out endometrial polyps, submucosal myoma, endometrial hyperplasia and endometrial cancer. - [place] Sonohysterogram; Future  4. Vaginal odor Bacterial Vaginosis confirmed by Wet Prep.  Will treat with tinidazole.  Usage reviewed and prescription sent to pharmacy. - WET PREP FOR [place], YEAST, CLUE  5. Screen for STD (sexually transmitted disease) - HIV antibody (with reflex) - RPR - Hepatitis C Antibody - Hepatitis B Surface AntiGEN  Other orders - tinidazole (TINDAMAX) 500 MG tablet; Take 4 tablets (2,000 mg total) by mouth daily for 2 days.  Katherine Hutchinson, it was a pleasure seeing you today!  I will inform you of your results as soon as they are available.

## 2017-12-27 LAB — HEPATITIS B SURFACE ANTIGEN: HEP B S AG: NONREACTIVE

## 2017-12-27 LAB — HEPATITIS C ANTIBODY
Hepatitis C Ab: NONREACTIVE
SIGNAL TO CUT-OFF: 0.06 (ref <1.00)

## 2017-12-27 LAB — HIV ANTIBODY (ROUTINE TESTING W REFLEX): HIV 1&2 Ab, 4th Generation: NONREACTIVE

## 2017-12-27 LAB — RPR: RPR Ser Ql: NONREACTIVE

## 2018-01-02 ENCOUNTER — Other Ambulatory Visit: Payer: BLUE CROSS/BLUE SHIELD

## 2018-01-02 ENCOUNTER — Ambulatory Visit: Payer: BLUE CROSS/BLUE SHIELD | Admitting: Obstetrics & Gynecology

## 2018-01-02 ENCOUNTER — Encounter: Payer: BLUE CROSS/BLUE SHIELD | Admitting: Obstetrics & Gynecology

## 2018-01-08 ENCOUNTER — Other Ambulatory Visit: Payer: Self-pay | Admitting: Internal Medicine

## 2018-01-08 DIAGNOSIS — Q613 Polycystic kidney, unspecified: Secondary | ICD-10-CM

## 2018-01-09 ENCOUNTER — Encounter: Payer: BLUE CROSS/BLUE SHIELD | Admitting: Obstetrics & Gynecology

## 2018-01-14 ENCOUNTER — Encounter: Payer: Self-pay | Admitting: Obstetrics & Gynecology

## 2018-01-14 ENCOUNTER — Ambulatory Visit (INDEPENDENT_AMBULATORY_CARE_PROVIDER_SITE_OTHER): Payer: BLUE CROSS/BLUE SHIELD

## 2018-01-14 ENCOUNTER — Ambulatory Visit (INDEPENDENT_AMBULATORY_CARE_PROVIDER_SITE_OTHER): Payer: BLUE CROSS/BLUE SHIELD | Admitting: Obstetrics & Gynecology

## 2018-01-14 DIAGNOSIS — R9389 Abnormal findings on diagnostic imaging of other specified body structures: Secondary | ICD-10-CM

## 2018-01-14 DIAGNOSIS — D219 Benign neoplasm of connective and other soft tissue, unspecified: Secondary | ICD-10-CM | POA: Diagnosis not present

## 2018-01-14 DIAGNOSIS — N858 Other specified noninflammatory disorders of uterus: Secondary | ICD-10-CM

## 2018-01-14 DIAGNOSIS — A630 Anogenital (venereal) warts: Secondary | ICD-10-CM | POA: Diagnosis not present

## 2018-01-14 DIAGNOSIS — N920 Excessive and frequent menstruation with regular cycle: Secondary | ICD-10-CM | POA: Diagnosis not present

## 2018-01-14 DIAGNOSIS — N859 Noninflammatory disorder of uterus, unspecified: Secondary | ICD-10-CM

## 2018-01-14 LAB — PREGNANCY, URINE: PREG TEST UR: NEGATIVE

## 2018-01-14 NOTE — Patient Instructions (Addendum)
1. Menorrhagia with regular cycle Menorrhagia with uterine fibroids and thickened endometrium.  On sonohysterogram today intrauterine lesions are seen probably corresponding to endometrial polyps, possibly submucosal fibroids.  Endometrial biopsy done.  Pending results.  Will schedule hysteroscopy with MyoSure excision of intra-uterine lesions and D&C.  Patient agrees with plan.  Pamphlet on hysteroscopy given to patient.  Follow-up preop. - Pregnancy, urine  2. Fibroids Subserosal, intra-and possibly submucosal fibroids.  3. Thickened endometrium Endometrial biopsy pending.  4. Lesion of endometrium Polyps and or submucosal fibroids.  We will proceed with excision.  5. Condyloma acuminatum of perianal region TCA 50% treatment of both condylomas at the perianal area.  Katherine Hutchinson, which was a pleasure seeing you today!  I will inform you of your results as soon as they are available.

## 2018-01-14 NOTE — Progress Notes (Addendum)
Katherine Hutchinson 27-Jul-1975 [id number]        42 y.o.  [id number] Single  RP: Menorrhagia/Fibroids with thickened Endometrial line for Sonohysterogram  HPI:  Heavy periods with LMP 12/30/2017.  Secondary anemia with Hb 9.0 on 12/26/2017.  Pelvic US 12/26/2017 showed many Fibroids SS, IM and possibly SM.  Endometrial line was thickened at 15.4 mm.   OB History  Gravida Para Term Preterm AB Living  6 3 3   2 3   SAB TAB Ectopic Multiple Live Births    2          # Outcome Date GA Lbr Len/2nd Weight Sex Delivery Anes PTL Lv  6 Gravida              Birth Comments: System Generated. Please review and update pregnancy details.  5 TAB           4 TAB           3 Term           2 Term           1 Term             Past medical history,surgical history, problem list, medications, allergies, family history and social history were all reviewed and documented in the EPIC chart.   Directed ROS with pertinent positives and negatives documented in the history of present illness/assessment and plan.  Exam:  There were no vitals filed for this visit. General appearance:  Normal                                                                    Sono Infusion Hysterogram ( procedure note)   The initial transvaginal ultrasound demonstrated the following:  T/V images.  Anteverted uterus with intramural fibroid and a prominent endometrial line measuring 31.5 mm.  Right ovary with a thick walled corpus luteum cysts measuring 2.1 x 2.4 cm and 1.3 x 1.9 cm.  Left ovary normal.  The speculum  was inserted and the cervix cleansed with Betadine solution after confirming that patient has no allergies.A small sonohysterography catheterwas utilized.  Insertion was facilitated with ring forceps, using a spear-like motion the catheter was inserted to the fundus of the uterus. The speculum is then removed carefully to avoid dislodging the catheter. The catheter was flushed with sterile saline delete prior to  insertion to rid it of small amounts of air.the sterile saline solution was infused into the uterine cavity as a vaginal ultrasound probe was then placed in the vagina for full visualization of the uterine cavity from a transvaginal approach. The following was noted:  Following injection of saline the endometrial cavity is filled showing 2 defects transvaginally measuring 2.1 x 1.5 cm and 1.3 x 0.8 cm and transabdominally of the left defect is seen measuring 1.9 x 1.2 cm.  The catheter was then removed after retrieving some of the saline from the intrauterine cavity. An endometrial biopsy was done. Patient tolerated procedure well. She had received a tablet of Aleve for discomfort.   Perianal warty lesions x2.  TCA 50% applied on both  UPT negative   Assessment/Plan:  42 y.o. [id number]   1. Menorrhagia with regular cycle Menorrhagia with uterine fibroids and thickened endometrium.  On sonohysterogram today intrauterine lesions are seen probably corresponding to endometrial polyps, possibly submucosal fibroids.  Endometrial biopsy done.  Pending results.  Will schedule hysteroscopy with MyoSure excision of intra-uterine lesions and D&C.  Patient agrees with plan.  Pamphlet on hysteroscopy given to patient.  Follow-up preop. - Pregnancy, urine  2. Fibroids Subserosal, intra-and possibly submucosal fibroids.  3. Thickened endometrium Endometrial biopsy pending.  4. Lesion of endometrium Polyps and or submucosal fibroids.  We will proceed with excision.  5. Condyloma acuminatum of perianal region TCA 50% treatment of both condylomas at the perianal area.  Counseling on above issues and coordination of care more than 50% for 15 minutes.  [name] MD, 12:41 PM 01/14/2018

## 2018-01-20 ENCOUNTER — Encounter: Payer: Self-pay | Admitting: Obstetrics & Gynecology

## 2018-01-20 ENCOUNTER — Ambulatory Visit (INDEPENDENT_AMBULATORY_CARE_PROVIDER_SITE_OTHER): Payer: Self-pay | Admitting: Obstetrics & Gynecology

## 2018-01-20 VITALS — BP 122/78 | Ht 64.0 in | Wt 241.0 lb

## 2018-01-20 DIAGNOSIS — D219 Benign neoplasm of connective and other soft tissue, unspecified: Secondary | ICD-10-CM

## 2018-01-20 DIAGNOSIS — N920 Excessive and frequent menstruation with regular cycle: Secondary | ICD-10-CM

## 2018-01-20 DIAGNOSIS — Z01419 Encounter for gynecological examination (general) (routine) without abnormal findings: Secondary | ICD-10-CM

## 2018-01-20 DIAGNOSIS — Z6841 Body Mass Index (BMI) 40.0 and over, adult: Secondary | ICD-10-CM

## 2018-01-20 DIAGNOSIS — Z1151 Encounter for screening for human papillomavirus (HPV): Secondary | ICD-10-CM

## 2018-01-20 NOTE — Progress Notes (Signed)
Katherine Hutchinson [birth year] [id number]   History:    42 y.o. [id number] Single  RP:  Established patient presenting for annual gyn exam   HPI: Menorrhagia.  HSC/Myosure excision/D+C scheduled for 02/07/2018.  SonoHysto 01/14/2018 IU lesions, probable polyps/SM myoma.  EBx benign Polyp 01/14/2018.  TCA on peri-anal condylomas on 01/14/2018.  Enlarged Uterus with Fibroids.  Desires preservation of fertility at this point.  Breasts normal.  Urine/BMs wnl.  BMI 41.37.  Not exercising regularly.  Health Labs with Fam MD.  Past medical history,surgical history, family history and social history were all reviewed and documented in the EPIC chart.  Gynecologic History Patient's last menstrual period was 12/30/2017. Contraception: condoms Last Pap: 05/2016. Results were: Negative/HPV HR neg Last mammogram: 06/2016. Results were: Benign Bone Density: Never Colonoscopy: Never  Obstetric History OB History  Gravida Para Term Preterm AB Living  6 3 3   2 3   SAB TAB Ectopic Multiple Live Births    2          # Outcome Date GA Lbr Len/2nd Weight Sex Delivery Anes PTL Lv  6 Gravida              Birth Comments: System Generated. Please review and update pregnancy details.  5 TAB           4 TAB           3 Term           2 Term           1 Term              ROS: A ROS was performed and pertinent positives and negatives are included in the history.  GENERAL: No fevers or chills. HEENT: No change in vision, no earache, sore throat or sinus congestion. NECK: No pain or stiffness. CARDIOVASCULAR: No chest pain or pressure. No palpitations. PULMONARY: No shortness of breath, cough or wheeze. GASTROINTESTINAL: No abdominal pain, nausea, vomiting or diarrhea, melena or bright red blood per rectum. GENITOURINARY: No urinary frequency, urgency, hesitancy or dysuria. MUSCULOSKELETAL: No joint or muscle pain, no back pain, no recent trauma. DERMATOLOGIC: No rash, no itching, no lesions. ENDOCRINE: No  polyuria, polydipsia, no heat or cold intolerance. No recent change in weight. HEMATOLOGICAL: No anemia or easy bruising or bleeding. NEUROLOGIC: No headache, seizures, numbness, tingling or weakness. PSYCHIATRIC: No depression, no loss of interest in normal activity or change in sleep pattern.     Exam:   Ht 5\' 4"  (1.626 m)   Wt 241 lb (109.3 kg)   LMP 12/30/2017   BMI 41.37 kg/m   Body mass index is 41.37 kg/m.  General appearance : Well developed well nourished female. No acute distress HEENT: Eyes: no retinal hemorrhage or exudates,  Neck supple, trachea midline, no carotid bruits, no thyroidmegaly Lungs: Clear to auscultation, no rhonchi or wheezes, or rib retractions  Heart: Regular rate and rhythm, no murmurs or gallops Breast:Examined in sitting and supine position were symmetrical in appearance, no palpable masses or tenderness,  no skin retraction, no nipple inversion, no nipple discharge, no skin discoloration, no axillary or supraclavicular lymphadenopathy Abdomen: no palpable masses or tenderness, no rebound or guarding Extremities: no edema or skin discoloration or tenderness  Pelvic: Vulva: Normal             Vagina: No gross lesions or discharge  Cervix: No gross lesions or discharge.  Pap/HPV HR done  Uterus  AV, enlarged and nodular, non-tender  and mobile  Adnexa  Without masses or tenderness  Anus: Normal   Assessment/Plan:  42 y.o. female for annual exam   1. Encounter for gynecological examination with Papanicolaou smear of cervix Gynecologic exam with uterine fibroids.  Pap test with high risk HPV done today.  Breast exam normal.  Will schedule screening mammogram now at the breast center.  Health labs with family physician. - PAP,TP IMGw/HPV RNA,rflx [id number],15/18  2. Special screening examination for human papillomavirus (HPV) - PAP,TP IMGw/HPV RNA,rflx [id number],78/47  3. Menorrhagia with regular cycle Uterine fibroids with 2 intrauterine lesions on  sonohysterogram probably corresponding to polyps or submucosal fibroids.  Endometrial biopsy benign polyp.  Scheduled for hysteroscopy with MyoSure excision and D&C.  Perianal condyloma treated with TCA 50% at last visit, will excise at the time of surgery if any condyloma present.  4. Fibroids Desires preservation of fertility at this time.  5. Class 3 severe obesity due to excess calories without serious comorbidity with body mass index (BMI) of 40.0 to 44.9 in adult [facility]) Recommend lower calorie/carb diet such as [employer].  Aerobic physical activities 5 times a week and weightlifting every 2 days.  Other orders - omeprazole (PRILOSEC) 20 MG capsule; Take 20 mg by mouth daily.  [name] MD, 3:39 PM 01/20/2018

## 2018-01-21 LAB — PAP, TP IMAGING W/ HPV RNA, RFLX HPV TYPE 16,18/45: HPV DNA High Risk: NOT DETECTED

## 2018-01-21 NOTE — Patient Instructions (Signed)
1. Encounter for gynecological examination with Papanicolaou smear of cervix Gynecologic exam with uterine fibroids.  Pap test with high risk HPV done today.  Breast exam normal.  Will schedule screening mammogram now at the breast center.  Health labs with family physician. - PAP,TP IMGw/HPV RNA,rflx [id number],30/86  2. Special screening examination for human papillomavirus (HPV) - PAP,TP IMGw/HPV RNA,rflx [id number],52/84  3. Menorrhagia with regular cycle Uterine fibroids with 2 intrauterine lesions on sonohysterogram probably corresponding to polyps or submucosal fibroids.  Endometrial biopsy benign polyp.  Scheduled for hysteroscopy with MyoSure excision and D&C.  Perianal condyloma treated with TCA 50% at last visit, will excise at the time of surgery if any condyloma present.  4. Fibroids Desires preservation of fertility at this time.  5. Class 3 severe obesity due to excess calories without serious comorbidity with body mass index (BMI) of 40.0 to 44.9 in adult [facility]) Recommend lower calorie/carb diet such as [employer].  Aerobic physical activities 5 times a week and weightlifting every 2 days.  Other orders - omeprazole (PRILOSEC) 20 MG capsule; Take 20 mg by mouth daily.  Katherine Hutchinson, it was a pleasure seeing you today!  I will inform you of your results as soon as they are available.

## 2018-01-22 ENCOUNTER — Other Ambulatory Visit: Payer: Self-pay | Admitting: Internal Medicine

## 2018-01-22 DIAGNOSIS — R5381 Other malaise: Secondary | ICD-10-CM

## 2018-01-23 ENCOUNTER — Other Ambulatory Visit: Payer: Self-pay | Admitting: Internal Medicine

## 2018-01-23 DIAGNOSIS — Z8249 Family history of ischemic heart disease and other diseases of the circulatory system: Secondary | ICD-10-CM

## 2018-01-24 ENCOUNTER — Telehealth: Payer: Self-pay

## 2018-01-24 NOTE — Telephone Encounter (Signed)
Patient called to cancel upcoming surgery for 02/07/18.  SHe said she has had a rough patch financially and was unable to pay her premiums on her ins plan. When she went to do that recently she was told it was terminated.  She said she will have to wait a little while and work on meeting her deductible before she can schedule. She will call me back when she is ready.

## 2018-01-31 NOTE — Telephone Encounter (Signed)
Patient informed with pap results.

## 2018-02-07 ENCOUNTER — Ambulatory Visit (HOSPITAL_BASED_OUTPATIENT_CLINIC_OR_DEPARTMENT_OTHER): Admit: 2018-02-07 | Payer: BLUE CROSS/BLUE SHIELD | Admitting: Obstetrics & Gynecology

## 2018-02-07 ENCOUNTER — Encounter (HOSPITAL_BASED_OUTPATIENT_CLINIC_OR_DEPARTMENT_OTHER): Payer: Self-pay

## 2018-02-07 SURGERY — DILATATION & CURETTAGE/HYSTEROSCOPY WITH MYOSURE
Anesthesia: General

## 2018-04-09 ENCOUNTER — Encounter: Payer: BLUE CROSS/BLUE SHIELD | Admitting: Obstetrics & Gynecology

## 2018-07-24 ENCOUNTER — Telehealth: Payer: Self-pay

## 2018-07-24 NOTE — Telephone Encounter (Signed)
Pt. Asking if she can have her children tested for COVID 19. States they do have a PCP. Instructed her to call their PCP and see what their recommendations are.

## 2019-02-17 ENCOUNTER — Emergency Department (HOSPITAL_COMMUNITY): Payer: BLUE CROSS/BLUE SHIELD

## 2019-02-17 ENCOUNTER — Encounter (HOSPITAL_COMMUNITY): Payer: Self-pay | Admitting: Emergency Medicine

## 2019-02-17 ENCOUNTER — Emergency Department (HOSPITAL_COMMUNITY)
Admission: EM | Admit: 2019-02-17 | Discharge: 2019-02-18 | Discharge status: Against Medical Advice | Payer: BLUE CROSS/BLUE SHIELD | Source: Home / Self Care

## 2019-02-17 ENCOUNTER — Other Ambulatory Visit: Payer: Self-pay

## 2019-02-17 DIAGNOSIS — D649 Anemia, unspecified: Secondary | ICD-10-CM | POA: Diagnosis not present

## 2019-02-17 DIAGNOSIS — Z5321 Procedure and treatment not carried out due to patient leaving prior to being seen by health care provider: Secondary | ICD-10-CM | POA: Insufficient documentation

## 2019-02-17 DIAGNOSIS — R0789 Other chest pain: Secondary | ICD-10-CM | POA: Diagnosis not present

## 2019-02-17 DIAGNOSIS — R079 Chest pain, unspecified: Secondary | ICD-10-CM | POA: Diagnosis present

## 2019-02-17 DIAGNOSIS — Z8249 Family history of ischemic heart disease and other diseases of the circulatory system: Secondary | ICD-10-CM | POA: Diagnosis not present

## 2019-02-17 DIAGNOSIS — I10 Essential (primary) hypertension: Secondary | ICD-10-CM | POA: Diagnosis not present

## 2019-02-17 DIAGNOSIS — Z8742 Personal history of other diseases of the female genital tract: Secondary | ICD-10-CM | POA: Diagnosis not present

## 2019-02-17 LAB — BASIC METABOLIC PANEL
Anion gap: 6 (ref 5–15)
BUN: 10 mg/dL (ref 6–20)
CO2: 25 mmol/L (ref 22–32)
Calcium: 9.2 mg/dL (ref 8.9–10.3)
Chloride: 108 mmol/L (ref 98–111)
Creatinine, Ser: 0.85 mg/dL (ref 0.44–1.00)
GFR calc Af Amer: >60 mL/min (ref >60)
GFR calc non Af Amer: >60 mL/min (ref >60)
Glucose, Bld: 90 mg/dL (ref 70–99)
Potassium: 3.4 mmol/L — ABNORMAL LOW (ref 3.5–5.1)
Sodium: 139 mmol/L (ref 135–145)

## 2019-02-17 LAB — CBC
HCT: 28.5 % — ABNORMAL LOW (ref 36.0–46.0)
Hemoglobin: 8.2 g/dL — ABNORMAL LOW (ref 12.0–15.0)
MCH: 23.2 pg — ABNORMAL LOW (ref 26.0–34.0)
MCHC: 28.8 g/dL — ABNORMAL LOW (ref 30.0–36.0)
MCV: 80.5 fL (ref 80.0–100.0)
Platelets: 303 10*3/uL (ref 150–400)
RBC: 3.54 MIL/uL — ABNORMAL LOW (ref 3.87–5.11)
RDW: 15.9 % — ABNORMAL HIGH (ref 11.5–15.5)
WBC: 5.6 10*3/uL (ref 4.0–10.5)
nRBC: 0 % (ref 0.0–0.2)

## 2019-02-17 LAB — I-STAT BETA HCG BLOOD, ED (MC, WL, AP ONLY): I-stat hCG, quantitative: <5 m[IU]/mL (ref <5)

## 2019-02-17 LAB — TROPONIN I (HIGH SENSITIVITY): Troponin I (High Sensitivity): 19 ng/L — ABNORMAL HIGH (ref <18)

## 2019-02-17 MED ORDER — SODIUM CHLORIDE 0.9% FLUSH: 3.0000 mL | Freq: Once | INTRAVENOUS | Status: DC

## 2019-02-17 NOTE — ED Triage Notes (Signed)
Patient reports intermittent central chest pain with SOB and nausea onset last week , no fever or diaphoresis , patient stated fibroid embolization procedure last 02/05/19 .

## 2019-02-18 ENCOUNTER — Other Ambulatory Visit: Payer: Self-pay

## 2019-02-18 ENCOUNTER — Emergency Department (HOSPITAL_COMMUNITY): Payer: BLUE CROSS/BLUE SHIELD

## 2019-02-18 ENCOUNTER — Encounter (HOSPITAL_COMMUNITY): Payer: Self-pay | Admitting: Emergency Medicine

## 2019-02-18 ENCOUNTER — Observation Stay (HOSPITAL_COMMUNITY)
Admission: EM | Admit: 2019-02-18 | Discharge: 2019-02-20 | Disposition: A | Discharge status: Home / Self Care | Payer: BLUE CROSS/BLUE SHIELD | Attending: Cardiology | Admitting: Cardiology

## 2019-02-18 DIAGNOSIS — I10 Essential (primary) hypertension: Secondary | ICD-10-CM | POA: Diagnosis not present

## 2019-02-18 DIAGNOSIS — Z8742 Personal history of other diseases of the female genital tract: Secondary | ICD-10-CM | POA: Insufficient documentation

## 2019-02-18 DIAGNOSIS — R079 Chest pain, unspecified: Secondary | ICD-10-CM

## 2019-02-18 DIAGNOSIS — Z8249 Family history of ischemic heart disease and other diseases of the circulatory system: Secondary | ICD-10-CM | POA: Diagnosis not present

## 2019-02-18 DIAGNOSIS — R0789 Other chest pain: Principal | ICD-10-CM | POA: Insufficient documentation

## 2019-02-18 DIAGNOSIS — D649 Anemia, unspecified: Secondary | ICD-10-CM | POA: Insufficient documentation

## 2019-02-18 LAB — TROPONIN I (HIGH SENSITIVITY)
Troponin I (High Sensitivity): 20 ng/L — ABNORMAL HIGH (ref <18)
Troponin I (High Sensitivity): 14 ng/L (ref <18)
Troponin I (High Sensitivity): 14 ng/L (ref <18)

## 2019-02-18 LAB — BASIC METABOLIC PANEL
Anion gap: 7 (ref 5–15)
BUN: 10 mg/dL (ref 6–20)
CO2: 26 mmol/L (ref 22–32)
Calcium: 9.4 mg/dL (ref 8.9–10.3)
Chloride: 104 mmol/L (ref 98–111)
Creatinine, Ser: 0.88 mg/dL (ref 0.44–1.00)
GFR calc Af Amer: >60 mL/min (ref >60)
GFR calc non Af Amer: >60 mL/min (ref >60)
Glucose, Bld: 88 mg/dL (ref 70–99)
Potassium: 3.7 mmol/L (ref 3.5–5.1)
Sodium: 137 mmol/L (ref 135–145)

## 2019-02-18 LAB — CBC
HCT: 31.7 % — ABNORMAL LOW (ref 36.0–46.0)
Hemoglobin: 9 g/dL — ABNORMAL LOW (ref 12.0–15.0)
MCH: 22.8 pg — ABNORMAL LOW (ref 26.0–34.0)
MCHC: 28.4 g/dL — ABNORMAL LOW (ref 30.0–36.0)
MCV: 80.3 fL (ref 80.0–100.0)
Platelets: 305 10*3/uL (ref 150–400)
RBC: 3.95 MIL/uL (ref 3.87–5.11)
RDW: 16.1 % — ABNORMAL HIGH (ref 11.5–15.5)
WBC: 4.6 10*3/uL (ref 4.0–10.5)
nRBC: 0 % (ref 0.0–0.2)

## 2019-02-18 LAB — I-STAT BETA HCG BLOOD, ED (NOT ORDERABLE): I-stat hCG, quantitative: <5 m[IU]/mL (ref <5)

## 2019-02-18 MED ORDER — ASPIRIN 300 MG RE SUPP
300.0000 mg | RECTAL | Status: AC
  Filled 2019-02-18: qty 1

## 2019-02-18 MED ORDER — AMLODIPINE BESYLATE 5 MG PO TABS
5.0000 mg | ORAL_TABLET | Freq: Every day | ORAL | Status: DC
  Administered 2019-02-18 – 2019-02-19 (×2): 5 mg via ORAL
  Filled 2019-02-18 (×3): qty 1

## 2019-02-18 MED ORDER — NITROGLYCERIN 0.4 MG SL SUBL: 0.4000 mg | SUBLINGUAL_TABLET | SUBLINGUAL | Status: DC | PRN

## 2019-02-18 MED ORDER — ACETAMINOPHEN 325 MG PO TABS: 650.0000 mg | ORAL_TABLET | ORAL | Status: DC | PRN

## 2019-02-18 MED ORDER — IOHEXOL 350 MG/ML SOLN
100.0000 mL | Freq: Once | INTRAVENOUS | Status: AC | PRN
  Administered 2019-02-18: 100 mL via INTRAVENOUS

## 2019-02-18 MED ORDER — VITAMIN D (ERGOCALCIFEROL) 1.25 MG (50000 UNIT) PO CAPS
50000.0000 [IU] | ORAL_CAPSULE | ORAL | Status: DC
  Administered 2019-02-18: 50000 [IU] via ORAL
  Filled 2019-02-18: qty 1

## 2019-02-18 MED ORDER — ATORVASTATIN CALCIUM 40 MG PO TABS
80.0000 mg | ORAL_TABLET | Freq: Every day | ORAL | Status: DC
  Administered 2019-02-18 – 2019-02-19 (×2): 80 mg via ORAL
  Filled 2019-02-18 (×2): qty 2

## 2019-02-18 MED ORDER — NITROGLYCERIN 2 % TD OINT
0.5000 [in_us] | TOPICAL_OINTMENT | Freq: Three times a day (TID) | TRANSDERMAL | Status: DC
  Administered 2019-02-18 – 2019-02-19 (×2): 0.5 [in_us] via TOPICAL
  Filled 2019-02-18: qty 30

## 2019-02-18 MED ORDER — SODIUM CHLORIDE 0.9 % IV SOLN: INTRAVENOUS | Status: DC

## 2019-02-18 MED ORDER — ENOXAPARIN SODIUM 40 MG/0.4ML ~~LOC~~ SOLN
40.0000 mg | SUBCUTANEOUS | Status: DC
  Filled 2019-02-18: qty 0.4

## 2019-02-18 MED ORDER — ONDANSETRON HCL 4 MG/2ML IJ SOLN: 4.0000 mg | Freq: Four times a day (QID) | INTRAMUSCULAR | Status: DC | PRN

## 2019-02-18 MED ORDER — SODIUM CHLORIDE (PF) 0.9 % IJ SOLN
INTRAMUSCULAR | Status: AC
  Filled 2019-02-18: qty 50

## 2019-02-18 MED ORDER — FERROUS SULFATE 325 (65 FE) MG PO TABS
325.0000 mg | ORAL_TABLET | Freq: Every day | ORAL | Status: DC
  Administered 2019-02-19 – 2019-02-20 (×2): 325 mg via ORAL
  Filled 2019-02-18 (×2): qty 1

## 2019-02-18 MED ORDER — ASPIRIN 81 MG PO CHEW
324.0000 mg | CHEWABLE_TABLET | ORAL | Status: AC
  Administered 2019-02-18: 324 mg via ORAL
  Filled 2019-02-18: qty 4

## 2019-02-18 MED ORDER — PANTOPRAZOLE SODIUM 40 MG PO TBEC
40.0000 mg | DELAYED_RELEASE_TABLET | Freq: Every day | ORAL | Status: DC
  Administered 2019-02-18 – 2019-02-20 (×3): 40 mg via ORAL
  Filled 2019-02-18 (×3): qty 1

## 2019-02-18 MED ORDER — ASPIRIN EC 81 MG PO TBEC
81.0000 mg | DELAYED_RELEASE_TABLET | Freq: Every day | ORAL | Status: DC
  Administered 2019-02-19 – 2019-02-20 (×2): 81 mg via ORAL
  Filled 2019-02-18 (×2): qty 1

## 2019-02-18 NOTE — ED Notes (Signed)
ED TO INPATIENT HANDOFF REPORT  Name/Age/Gender Katherine Hutchinson 42 y.o. female  Code Status   Home/SNF/Other Home  Chief Complaint Chest pain [R07.9]  Level of Care/Admitting Diagnosis ED Disposition    ED Disposition Condition [facility] Area: [place] [id number]  Level of Care: Telemetry [5]  Admit to tele based on following criteria: Monitor for Ischemic changes  Covid Evaluation: Asymptomatic Screening Protocol (No Symptoms)  Diagnosis: Chest pain [id number]  Admitting Physician: Charolette Forward [[id number]]  Attending Physician: Charolette Forward [[id number]]       Medical History Past Medical History:  Diagnosis Date  . Anemia   . Chlamydia   . Fibroids    uterine  . FIBROIDS, UTERUS 06/04/2008   Qualifier: Diagnosis of  By: [name], [name]    . Genital warts 03/06/2011  . H/O Hutchinson breast biopsy    benign  . History of blood transfusion    after d&c  . Morbid obesity ([place])   . Obesity   . Uterus, adenomyosis 01/17/2011   Suggested by transvaginal US in 09/2010     Allergies Allergies  Allergen Reactions  . Shellfish-Derived Products Anaphylaxis, Itching and Other (See Comments)    Throat itching, eyes itchy..    IV Location/Drains/Wounds Patient Lines/Drains/Airways Status   Active Line/Drains/Airways    Name:   Placement date:   Placement time:   Site:   Days:   Peripheral IV 02/18/19 Left Antecubital   02/18/19    1703    Antecubital   less than 1   Incision 04/19/11 Perineum Other (Comment)   04/19/11    1641     2862          Labs/Imaging Results for orders placed or performed during the hospital encounter of 02/18/19 (from the past 48 hour(s))  Basic metabolic panel     Status: None   Collection Time: 02/18/19  2:40 PM  Result Value Ref Range   Sodium 137 135 - 145 mmol/L   Potassium 3.7 3.5 - 5.1 mmol/L   Chloride 104 98 - 111 mmol/L   CO2 26 22 - 32 mmol/L   Glucose, Bld 88 70 - 99 mg/dL   BUN 10 6 - 20 mg/dL    Creatinine, Ser 0.88 0.44 - 1.00 mg/dL   Calcium 9.4 8.9 - 10.3 mg/dL   GFR calc non Af Amer >60 >60 mL/min   GFR calc Af Amer >60 >60 mL/min   Anion gap 7 5 - 15    Comment: Performed at [facility], [place] [address] [place], [place] [address]  CBC     Status: Abnormal   Collection Time: 02/18/19  2:40 PM  Result Value Ref Range   WBC 4.6 4.0 - 10.5 K/uL   RBC 3.95 3.87 - 5.11 MIL/uL   Hemoglobin 9.0 (L) 12.0 - 15.0 g/dL   HCT 31.7 (L) 36.0 - 46.0 %   MCV 80.3 80.0 - 100.0 fL   MCH 22.8 (L) 26.0 - 34.0 pg   MCHC 28.4 (L) 30.0 - 36.0 g/dL   RDW 16.1 (H) 11.5 - 15.5 %   Platelets 305 150 - 400 K/uL   nRBC 0.0 0.0 - 0.2 %    Comment: Performed at [facility], [place] [address] [place], [place] [address]  Troponin I (High Sensitivity)     Status: None   Collection Time: 02/18/19  2:40 PM  Result Value Ref Range   Troponin I (High  Sensitivity) 14 <18 ng/L    Comment: (NOTE) Elevated high sensitivity troponin I (hsTnI) values and significant  changes across serial measurements may suggest ACS but many other  chronic and acute conditions are known to elevate hsTnI results.  Refer to the "Links" section for chest pain algorithms and additional  guidance. Performed at [facility], [place] [address] [place], [place] [address]   I-Stat beta hCG blood, ED     Status: None   Collection Time: 02/18/19  2:52 PM  Result Value Ref Range   I-stat hCG, quantitative <5.0 <5 mIU/mL   Comment 3            Comment:   GEST. AGE      CONC.  (mIU/mL)   <=1 WEEK        5 - 50     2 WEEKS       50 - 500     3 WEEKS       100 - 10,000     4 WEEKS     1,000 - 30,000        FEMALE AND NON-PREGNANT FEMALE:     LESS THAN 5 mIU/mL   Troponin I (High Sensitivity)     Status: None   Collection Time: 02/18/19  5:03 PM  Result Value Ref Range   Troponin I (High Sensitivity) 14 <18 ng/L    Comment: (NOTE) Elevated high sensitivity troponin I  (hsTnI) values and significant  changes across serial measurements may suggest ACS but many other  chronic and acute conditions are known to elevate hsTnI results.  Refer to the "Links" section for chest pain algorithms and additional  guidance. Performed at [facility], [place] [address], [place], [place] [address]    DG Chest 2 View  Result Date: 02/18/2019 CLINICAL DATA:  43 year old female with chest pain. EXAM: CHEST - 2 VIEW COMPARISON:  Chest radiograph dated 02/17/2019. FINDINGS: The heart size and mediastinal contours are within normal limits. Both lungs are clear. The visualized skeletal structures are unremarkable. IMPRESSION: No active cardiopulmonary disease. Electronically Signed   By: Anner Crete M.D.   On: 02/18/2019 14:42   DG Chest 2 View  Result Date: 02/17/2019 CLINICAL DATA:  Chest pain and shortness of breath for approximately 10 days. EXAM: CHEST - 2 VIEW COMPARISON:  05/17/2018 FINDINGS: The heart size and mediastinal contours are within normal limits. Both lungs are clear. The visualized skeletal structures are unremarkable. IMPRESSION: No active cardiopulmonary disease. Electronically Signed   By: Marlaine Hind M.D.   On: 02/17/2019 21:46   CTA Chest for PE  Result Date: 02/18/2019 CLINICAL DATA:  Chest pain, shortness of breath, recent UFE EXAM: CT ANGIOGRAPHY CHEST WITH CONTRAST TECHNIQUE: Multidetector CT imaging of the chest was performed using the standard protocol during bolus administration of intravenous contrast. Multiplanar CT image reconstructions and MIPs were obtained to evaluate the vascular anatomy. CONTRAST:  170mL OMNIPAQUE IOHEXOL 350 MG/ML SOLN COMPARISON:  Chest radiographs dated 02/18/2019 FINDINGS: Cardiovascular: Satisfactory opacification of the bilateral pulmonary arteries to the segmental level. No evidence of pulmonary embolism. No evidence of thoracic aortic aneurysm or dissection. Mild cardiomegaly.  No pericardial  effusion. Mediastinum/Nodes: No suspicious mediastinal lymphadenopathy. Visualized thyroid is unremarkable. Lungs/Pleura: Lungs are clear. No suspicious pulmonary nodules. No focal consolidation. No pleural effusion or pneumothorax. Upper Abdomen: Visualized upper abdomen is notable for a central Hutchinson hepatic cysts measuring up to 2.9 cm and bilateral renal cysts measuring up to 4.8  cm in the Hutchinson upper pole. Musculoskeletal: Mild degenerative changes of the visualized thoracolumbar spine. Review of the MIP images confirms the above findings. IMPRESSION: No evidence of pulmonary embolism. No evidence of acute cardiopulmonary disease. Electronically Signed   By: Julian Hy M.D.   On: 02/18/2019 19:08    Pending Labs Unresulted Labs (From admission, onward)    Start     Ordered   02/18/19 2002  SARS CORONAVIRUS 2 (TAT 6-24 HRS) Nasopharyngeal Nasopharyngeal Swab  (Tier 3 (TAT 6-24 hrs))  Once,   STAT    Question Answer Comment  Is this test for diagnosis or screening Screening   Symptomatic for COVID-19 as defined by CDC No   Hospitalized for COVID-19 No   Admitted to ICU for COVID-19 No   Previously tested for COVID-19 No   Resident in a congregate (group) care setting Unknown   Employed in healthcare setting Unknown   Pregnant Unknown      02/18/19 2001   Signed and Held  HIV Antibody (routine testing w rflx)  (HIV Antibody (Routine testing w reflex) panel)  Once,   R     Signed and Held   Signed and Held  CBC  (enoxaparin (LOVENOX)    CrCl >/= 30 ml/min)  Once,   R    Comments: Baseline for enoxaparin therapy IF NOT ALREADY DRAWN.  Notify MD if PLT < 100 K.    Signed and Held   Signed and Held  Creatinine, serum  (enoxaparin (LOVENOX)    CrCl >/= 30 ml/min)  Once,   R    Comments: Baseline for enoxaparin therapy IF NOT ALREADY DRAWN.    Signed and Held   Signed and Held  Creatinine, serum  (enoxaparin (LOVENOX)    CrCl >/= 30 ml/min)  Weekly,   R    Comments: while on  enoxaparin therapy    Signed and Held   Signed and Held  Basic metabolic panel  Tomorrow morning,   R     Signed and Held   Signed and Held  Lipid panel  Tomorrow morning,   R     Signed and Held   Signed and Held  CBC  Tomorrow morning,   R     Signed and Held          Vitals/Pain Today's Vitals   02/18/19 1920 02/18/19 1943 02/18/19 2028 02/18/19 2111  BP: (!) 140/100     Pulse: (!) 57     Resp: 12     Temp:      TempSrc:      SpO2: 100%     Weight:      Height:      PainSc:  3  3  0-No pain    Isolation Precautions No active isolations  Medications Medications  sodium chloride (PF) 0.9 % injection (has no administration in time range)  iohexol (OMNIPAQUE) 350 MG/ML injection 100 mL (100 mLs Intravenous Contrast Given 02/18/19 1832)    Mobility walks

## 2019-02-18 NOTE — ED Notes (Signed)
Pt told this tech she needed to leave.

## 2019-02-18 NOTE — H&P (Signed)
Katherine Hutchinson is an 43 y.o. female.   Chief Complaint: Recurrent chest pain HPI: Patient is 43 year old female with past medical history significant for chronic anemia history of fibroid tumor of the uterus, morbid obesity, family history of coronary artery disease brother died at the age of 71 due to massive MI complicated by CHF, recently had embolization of the uterine artery for fibroid tumor came to ER complaining of recurrent retrosternal chest pain described as stabbing/dull aching associated with mild shortness of breath and nausea off and on for last 10 days states chest pain is anywhere between grade 1-4 lasting few seconds to minutes occasionally radiating to left arm.  Denies any diaphoresis denies PND orthopnea leg swelling denies palpitation lightheadedness or syncope but but occasionally feels skipping of the heartbeat.  Denies any recent cardiac work-up states her brother passed away due to massive MI complicated by CHF at the age of 55.  States has appointment to see [facility]ologist at [facility] mid January.  Patient was seen at [facility] ED last night and was noted to have minimally elevated troponin patient left because there was a long wait and due to recurrent chest pain decided to come to ED here at [facility].  Patient presently denies any chest pain.  Denies shortness of breath.  Patient was noted to have elevated blood pressure states has not taken any blood pressure medications in the past.  Patient had repeat high-sensitivity troponin I which is in normal range EKG done in the ED showed normal sinus rhythm with LVH with strain pattern.  Patient also had CT of the chest which showed no evidence of pulmonary embolism or dissection.  Past Medical History:  Diagnosis Date  . Anemia   . Chlamydia   . Fibroids    uterine  . FIBROIDS, UTERUS 06/04/2008   Qualifier: Diagnosis of  By: [name], [name]    . Genital warts 03/06/2011  . H/O right breast biopsy    benign  .  History of blood transfusion    after d&c  . Morbid obesity ([place])   . Obesity   . Uterus, adenomyosis 01/17/2011   Suggested by transvaginal US in 09/2010     Past Surgical History:  Procedure Laterality Date  . BREAST EXCISIONAL BIOPSY Right 03/2004  . BREAST LUMPECTOMY     benign  . DILATION AND CURETTAGE, DIAGNOSTIC / THERAPEUTIC    . DILATION AND EVACUATION  04/19/2011   Procedure: DILATATION AND EVACUATION;  Surgeon: Guss Bunde, MD;  Location: [facility];  Service: Gynecology;  Laterality: N/A;  . TOOTH EXTRACTION  02/22/2014    Family History  Problem Relation Age of Onset  . Hypertension Mother   . Colon polyps Mother   . Hypertension Sister   . Heart disease Brother   . Hypertension Father   . Stroke Maternal Aunt   . Hypertension Maternal Grandmother   . Colon cancer Maternal Grandfather   . Anesthesia problems Neg Hx   . Stomach cancer Neg Hx    Social History:  reports that she has never smoked. She has never used smokeless tobacco. She reports that she does not drink alcohol or use drugs.  Allergies:  Allergies  Allergen Reactions  . Shellfish-Derived Products Anaphylaxis, Itching and Other (See Comments)    Throat itching, eyes itchy..    (Not in a hospital admission)   Results for orders placed or performed during the hospital encounter of 02/18/19 (from the past 48 hour(s))  Basic metabolic panel     Status: None   Collection Time: 02/18/19  2:40 PM  Result Value Ref Range   Sodium 137 135 - 145 mmol/L   Potassium 3.7 3.5 - 5.1 mmol/L   Chloride 104 98 - 111 mmol/L   CO2 26 22 - 32 mmol/L   Glucose, Bld 88 70 - 99 mg/dL   BUN 10 6 - 20 mg/dL   Creatinine, Ser 0.88 0.44 - 1.00 mg/dL   Calcium 9.4 8.9 - 10.3 mg/dL   GFR calc non Af Amer >60 >60 mL/min   GFR calc Af Amer >60 >60 mL/min   Anion gap 7 5 - 15    Comment: Performed at [facility], [place] [address] [place], [place] [address]  CBC     Status: Abnormal   Collection  Time: 02/18/19  2:40 PM  Result Value Ref Range   WBC 4.6 4.0 - 10.5 K/uL   RBC 3.95 3.87 - 5.11 MIL/uL   Hemoglobin 9.0 (L) 12.0 - 15.0 g/dL   HCT 31.7 (L) 36.0 - 46.0 %   MCV 80.3 80.0 - 100.0 fL   MCH 22.8 (L) 26.0 - 34.0 pg   MCHC 28.4 (L) 30.0 - 36.0 g/dL   RDW 16.1 (H) 11.5 - 15.5 %   Platelets 305 150 - 400 K/uL   nRBC 0.0 0.0 - 0.2 %    Comment: Performed at [facility], [place] [address], [place], [place] [address]  Troponin I (High Sensitivity)     Status: None   Collection Time: 02/18/19  2:40 PM  Result Value Ref Range   Troponin I (High Sensitivity) 14 <18 ng/L    Comment: (NOTE) Elevated high sensitivity troponin I (hsTnI) values and significant  changes across serial measurements may suggest ACS but many other  chronic and acute conditions are known to elevate hsTnI results.  Refer to the "Links" section for chest pain algorithms and additional  guidance. Performed at [facility], [place] [address], [place], [place] [address]   I-Stat beta hCG blood, ED     Status: None   Collection Time: 02/18/19  2:52 PM  Result Value Ref Range   I-stat hCG, quantitative <5.0 <5 mIU/mL   Comment 3            Comment:   GEST. AGE      CONC.  (mIU/mL)   <=1 WEEK        5 - 50     2 WEEKS       50 - 500     3 WEEKS       100 - 10,000     4 WEEKS     1,000 - 30,000        FEMALE AND NON-PREGNANT FEMALE:     LESS THAN 5 mIU/mL   Troponin I (High Sensitivity)     Status: None   Collection Time: 02/18/19  5:03 PM  Result Value Ref Range   Troponin I (High Sensitivity) 14 <18 ng/L    Comment: (NOTE) Elevated high sensitivity troponin I (hsTnI) values and significant  changes across serial measurements may suggest ACS but many other  chronic and acute conditions are known to elevate hsTnI results.  Refer to the "Links" section for chest pain algorithms and additional  guidance. Performed at [facility], [place] [address], [place], [place] [address]    DG Chest 2 View  Result Date: 02/18/2019 CLINICAL  DATA:  43 year old female with chest pain. EXAM: CHEST - 2 VIEW COMPARISON:  Chest radiograph dated 02/17/2019. FINDINGS: The heart size and mediastinal contours are within normal limits. Both lungs are clear. The visualized skeletal structures are unremarkable. IMPRESSION: No active cardiopulmonary disease. Electronically Signed   By: Anner Crete M.D.   On: 02/18/2019 14:42   DG Chest 2 View  Result Date: 02/17/2019 CLINICAL DATA:  Chest pain and shortness of breath for approximately 10 days. EXAM: CHEST - 2 VIEW COMPARISON:  05/17/2018 FINDINGS: The heart size and mediastinal contours are within normal limits. Both lungs are clear. The visualized skeletal structures are unremarkable. IMPRESSION: No active cardiopulmonary disease. Electronically Signed   By: Marlaine Hind M.D.   On: 02/17/2019 21:46   CTA Chest for PE  Result Date: 02/18/2019 CLINICAL DATA:  Chest pain, shortness of breath, recent UFE EXAM: CT ANGIOGRAPHY CHEST WITH CONTRAST TECHNIQUE: Multidetector CT imaging of the chest was performed using the standard protocol during bolus administration of intravenous contrast. Multiplanar CT image reconstructions and MIPs were obtained to evaluate the vascular anatomy. CONTRAST:  121mL OMNIPAQUE IOHEXOL 350 MG/ML SOLN COMPARISON:  Chest radiographs dated 02/18/2019 FINDINGS: Cardiovascular: Satisfactory opacification of the bilateral pulmonary arteries to the segmental level. No evidence of pulmonary embolism. No evidence of thoracic aortic aneurysm or dissection. Mild cardiomegaly.  No pericardial effusion. Mediastinum/Nodes: No suspicious mediastinal lymphadenopathy. Visualized thyroid is unremarkable. Lungs/Pleura: Lungs are clear. No suspicious pulmonary nodules. No focal consolidation. No pleural effusion or pneumothorax. Upper Abdomen: Visualized upper abdomen is notable for a central right hepatic cysts  measuring up to 2.9 cm and bilateral renal cysts measuring up to 4.8 cm in the right upper pole. Musculoskeletal: Mild degenerative changes of the visualized thoracolumbar spine. Review of the MIP images confirms the above findings. IMPRESSION: No evidence of pulmonary embolism. No evidence of acute cardiopulmonary disease. Electronically Signed   By: Julian Hy M.D.   On: 02/18/2019 19:08    Review of Systems  Constitutional: Negative for activity change, appetite change and fever.  HENT: Negative for congestion, ear discharge and sore throat.   Eyes: Negative for discharge.  Respiratory: Positive for shortness of breath.   Cardiovascular: Positive for chest pain. Negative for leg swelling.  Gastrointestinal: Negative for abdominal pain.  Endocrine: Negative for heat intolerance.  Genitourinary: Negative for difficulty urinating.  Neurological: Negative for dizziness and syncope.    Blood pressure (!) 140/100, pulse (!) 57, temperature 98 F (36.7 C), temperature source Oral, resp. rate 12, height 5\' 5"  (1.651 m), weight 109.8 kg, last menstrual period 01/23/2019, SpO2 100 %. Physical Exam  Constitutional: She is oriented to person, place, and time. She appears well-nourished.  HENT:  Head: Normocephalic and atraumatic.  Eyes: Pupils are equal, round, and reactive to light. Conjunctivae are normal. Left eye exhibits no discharge.  Neck: No JVD present. No tracheal deviation present. No thyromegaly present.  Cardiovascular: Normal rate and regular rhythm.  Murmur (Soft systolic murmur noted no S3 gallop) heard. Respiratory: Effort normal and breath sounds normal. No respiratory distress. She has no wheezes. She has no rales.  GI: Soft. She exhibits no distension. There is no abdominal tenderness. There is no rebound.  Musculoskeletal:        General: No tenderness, deformity or edema.     Cervical back: Normal range of motion and neck supple.  Neurological: She is alert and  oriented to person, place, and time.     Assessment/Plan Atypical chest pain with some  features worrisome for angina rule out MI New onset hypertension Morbid obesity Strong family history of coronary artery disease Chronic anemia History of uterine fibroids status post recent embolization Plan As per orders  Charolette Forward, MD 02/18/2019, 8:26 PM

## 2019-02-18 NOTE — ED Provider Notes (Signed)
[facility] Emergency Department Provider Note MRN:  [id number]  Arrival date & time: 02/18/19     Chief Complaint   Chest Pain   History of Present Illness   Katherine Hutchinson is a 43 y.o. year-old female with a history of fibroids, obesity presenting to the ED with chief complaint of chest pain.  Intermittent pressure-like chest pain and shortness of breath for the past 1 to 2 weeks.  Has been having the symptoms ever since her surgery on 12-19.  Symptoms worse with exertion.  At times the chest pain is described as stabbing and worse with deep breath.  Denies headache, no fever, no cough, no abdominal pain, no vaginal bleeding or discharge, no other complaints.  Review of Systems  A complete 10 system review of systems was obtained and all systems are negative except as noted in the HPI and PMH.   Patient's Health History    Past Medical History:  Diagnosis Date  . Anemia   . Chlamydia   . Fibroids    uterine  . FIBROIDS, UTERUS 06/04/2008   Qualifier: Diagnosis of  By: [name], [name]    . Genital warts 03/06/2011  . H/O right breast biopsy    benign  . History of blood transfusion    after d&c  . Morbid obesity ([place])   . Obesity   . Uterus, adenomyosis 01/17/2011   Suggested by transvaginal US in 09/2010     Past Surgical History:  Procedure Laterality Date  . BREAST EXCISIONAL BIOPSY Right 03/2004  . BREAST LUMPECTOMY     benign  . DILATION AND CURETTAGE, DIAGNOSTIC / THERAPEUTIC    . DILATION AND EVACUATION  04/19/2011   Procedure: DILATATION AND EVACUATION;  Surgeon: Guss Bunde, MD;  Location: [facility];  Service: Gynecology;  Laterality: N/A;  . TOOTH EXTRACTION  02/22/2014    Family History  Problem Relation Age of Onset  . Hypertension Mother   . Colon polyps Mother   . Hypertension Sister   . Heart disease Brother   . Hypertension Father   . Stroke Maternal Aunt   . Hypertension Maternal Grandmother   . Colon cancer Maternal  Grandfather   . Anesthesia problems Neg Hx   . Stomach cancer Neg Hx     Social History   Socioeconomic History  . Marital status: Single    Spouse name: Not on file  . Number of children: 3  . Years of education: Not on file  . Highest education level: Not on file  Occupational History  . Occupation: food service  Tobacco Use  . Smoking status: Never Smoker  . Smokeless tobacco: Never Used  Substance and Sexual Activity  . Alcohol use: No  . Drug use: No    Comment: Pt admits to h/o marijuana use  . Sexual activity: Yes    Partners: Male    Birth control/protection: None    Comment: 1st intercourse- 89, partners- 43,  current partner- 21 yrs   Other Topics Concern  . Not on file  Social History Narrative   Financial assistance approved for 100% discount at [facility] and has [facility] card per Bonna Gains   02/01/2010         Social Determinants of Health   Financial Resource Strain:   . Difficulty of Paying Living Expenses: Not on file  Food Insecurity:   . Worried About Charity fundraiser in the Last Year: Not on file  . Ran Out of  Food in the Last Year: Not on file  Transportation Needs:   . Lack of Transportation (Medical): Not on file  . Lack of Transportation (Non-Medical): Not on file  Physical Activity:   . Days of Exercise per Week: Not on file  . Minutes of Exercise per Session: Not on file  Stress:   . Feeling of Stress : Not on file  Social Connections:   . Frequency of Communication with Friends and Family: Not on file  . Frequency of Social Gatherings with Friends and Family: Not on file  . Attends Religious Services: Not on file  . Active Member of Clubs or Organizations: Not on file  . Attends Archivist Meetings: Not on file  . Marital Status: Not on file  Intimate Partner Violence:   . Fear of Current or Ex-Partner: Not on file  . Emotionally Abused: Not on file  . Physically Abused: Not on file  . Sexually Abused: Not on file      Physical Exam  Vital Signs and Nursing Notes reviewed Vitals:   02/18/19 1711 02/18/19 1920  BP: (!) 142/86 (!) 140/100  Pulse: 67 (!) 57  Resp: 18 12  Temp:    SpO2: 100% 100%    CONSTITUTIONAL: Well-appearing, NAD NEURO:  Alert and oriented x 3, no focal deficits EYES:  eyes equal and reactive ENT/NECK:  no LAD, no JVD CARDIO: Regular rate, well-perfused, normal S1 and S2 PULM:  CTAB no wheezing or rhonchi GI/GU:  normal bowel sounds, non-distended, non-tender MSK/SPINE:  No gross deformities, no edema SKIN:  no rash, atraumatic PSYCH:  Appropriate speech and behavior  Diagnostic and Interventional Summary    EKG Interpretation  Date/Time:  Wednesday February 18 2019 14:07:32 EST Ventricular Rate:  85 PR Interval:    QRS Duration: 83 QT Interval:  389 QTC Calculation: 463 R Axis:   69 Text Interpretation: Sinus rhythm Multiple ventricular premature complexes Consider left ventricular hypertrophy Abnrm T, consider ischemia, anterolateral lds Confirmed by Gerlene Fee [phone]) on 02/18/2019 5:30:49 PM      Labs Reviewed  CBC - Abnormal; Notable for the following components:      Result Value   Hemoglobin 9.0 (*)    HCT 31.7 (*)    MCH 22.8 (*)    MCHC 28.4 (*)    RDW 16.1 (*)    All other components within normal limits  SARS CORONAVIRUS 2 (TAT 6-24 HRS)  BASIC METABOLIC PANEL  I-STAT BETA HCG BLOOD, ED ([facility], [facility], [facility] ONLY)  I-STAT BETA HCG BLOOD, ED (NOT ORDERABLE)  TROPONIN I (HIGH SENSITIVITY)  TROPONIN I (HIGH SENSITIVITY)    CTA Chest for PE  Final Result    DG Chest 2 View  Final Result      Medications  sodium chloride (PF) 0.9 % injection (has no administration in time range)  iohexol (OMNIPAQUE) 350 MG/ML injection 100 mL (100 mLs Intravenous Contrast Given 02/18/19 1832)     Procedures  /  Critical Care .Critical Care Performed by: Maudie Flakes, MD Authorized by: Maudie Flakes, MD   Critical care provider statement:    Critical  care time (minutes):  32   Critical care was necessary to treat or prevent imminent or life-threatening deterioration of the following conditions: Concern for unstable angina.   Critical care was time spent personally by me on the following activities:  Discussions with consultants, evaluation of patient's response to treatment, examination of patient, ordering and performing treatments and interventions, ordering and  review of laboratory studies, ordering and review of radiographic studies, pulse oximetry, re-evaluation of patient's condition, obtaining history from patient or surrogate and review of old charts    ED Course and Medical Decision Making  I have reviewed the triage vital signs and the nursing notes.  Pertinent labs & imaging results that were available during my care of the patient were reviewed by me and considered in my medical decision making (see below for details).     Concern for postoperative pulmonary embolism in this 43 year old female with chest pain or shortness of breath, at times pleuritic, recent fibroid embolization procedure.  Will need CTA to exclude.  Patient is also exhibiting EKG abnormalities, left without being seen yesterday evening, had elevated troponins at that time.  Will likely need to consult cardiology to discuss this EKG.  Discussed case with Dr. Terrence Dupont of cardiology.  CTA is without pulmonary embolism.  Given her EKG, family history, elevated troponins yesterday, Dr. Terrence Dupont will admit for stress testing.  Barth Kirks. Sedonia Small, MD [place] [email or web address]  Final Clinical Impressions(s) / ED Diagnoses     ICD-10-CM   1. Chest pain, unspecified type  R07.9     ED Discharge Orders    None       Discharge Instructions Discussed with and Provided to Patient:   Discharge Instructions   None       Maudie Flakes, MD 02/18/19 2003

## 2019-02-18 NOTE — ED Triage Notes (Signed)
Patient reports intermittent left chest pain with SOB since 12/19 after procedure for uterine fibroid embolism. Left before being seen last night at [facility] for same.

## 2019-02-18 NOTE — ED Notes (Signed)
ED Provider at bedside. 

## 2019-02-19 ENCOUNTER — Ambulatory Visit (HOSPITAL_COMMUNITY)
Admission: RE | Admit: 2019-02-19 | Discharge: 2019-02-19 | Disposition: A | Discharge status: Home / Self Care | Payer: BLUE CROSS/BLUE SHIELD | Source: Ambulatory Visit | Attending: Cardiology | Admitting: Cardiology

## 2019-02-19 ENCOUNTER — Other Ambulatory Visit: Payer: Self-pay

## 2019-02-19 ENCOUNTER — Observation Stay (HOSPITAL_COMMUNITY): Payer: BLUE CROSS/BLUE SHIELD

## 2019-02-19 ENCOUNTER — Encounter (HOSPITAL_COMMUNITY): Payer: Self-pay | Admitting: Cardiology

## 2019-02-19 ENCOUNTER — Other Ambulatory Visit (HOSPITAL_COMMUNITY): Payer: BLUE CROSS/BLUE SHIELD

## 2019-02-19 DIAGNOSIS — I10 Essential (primary) hypertension: Secondary | ICD-10-CM | POA: Diagnosis not present

## 2019-02-19 DIAGNOSIS — R0602 Shortness of breath: Secondary | ICD-10-CM | POA: Insufficient documentation

## 2019-02-19 DIAGNOSIS — R079 Chest pain, unspecified: Secondary | ICD-10-CM | POA: Insufficient documentation

## 2019-02-19 DIAGNOSIS — Z8249 Family history of ischemic heart disease and other diseases of the circulatory system: Secondary | ICD-10-CM | POA: Diagnosis not present

## 2019-02-19 DIAGNOSIS — R0789 Other chest pain: Secondary | ICD-10-CM | POA: Diagnosis not present

## 2019-02-19 LAB — BASIC METABOLIC PANEL
Anion gap: 8 (ref 5–15)
BUN: 10 mg/dL (ref 6–20)
CO2: 24 mmol/L (ref 22–32)
Calcium: 9.4 mg/dL (ref 8.9–10.3)
Chloride: 108 mmol/L (ref 98–111)
Creatinine, Ser: 0.82 mg/dL (ref 0.44–1.00)
GFR calc Af Amer: >60 mL/min (ref >60)
GFR calc non Af Amer: >60 mL/min (ref >60)
Glucose, Bld: 105 mg/dL — ABNORMAL HIGH (ref 70–99)
Potassium: 3.3 mmol/L — ABNORMAL LOW (ref 3.5–5.1)
Sodium: 140 mmol/L (ref 135–145)

## 2019-02-19 LAB — LIPID PANEL
Cholesterol: 160 mg/dL (ref 0–200)
HDL: 46 mg/dL (ref >40)
LDL Cholesterol: 95 mg/dL (ref 0–99)
Total CHOL/HDL Ratio: 3.5 RATIO
Triglycerides: 95 mg/dL (ref <150)
VLDL: 19 mg/dL (ref 0–40)

## 2019-02-19 LAB — CBC
HCT: 31 % — ABNORMAL LOW (ref 36.0–46.0)
Hemoglobin: 8.8 g/dL — ABNORMAL LOW (ref 12.0–15.0)
MCH: 22.9 pg — ABNORMAL LOW (ref 26.0–34.0)
MCHC: 28.4 g/dL — ABNORMAL LOW (ref 30.0–36.0)
MCV: 80.7 fL (ref 80.0–100.0)
Platelets: 284 10*3/uL (ref 150–400)
RBC: 3.84 MIL/uL — ABNORMAL LOW (ref 3.87–5.11)
RDW: 16.2 % — ABNORMAL HIGH (ref 11.5–15.5)
WBC: 6.7 10*3/uL (ref 4.0–10.5)
nRBC: 0 % (ref 0.0–0.2)

## 2019-02-19 LAB — TROPONIN I (HIGH SENSITIVITY)
Troponin I (High Sensitivity): 13 ng/L (ref <18)
Troponin I (High Sensitivity): 13 ng/L (ref <18)

## 2019-02-19 LAB — HIV ANTIBODY (ROUTINE TESTING W REFLEX): HIV Screen 4th Generation wRfx: NONREACTIVE

## 2019-02-19 LAB — SARS CORONAVIRUS 2 (TAT 6-24 HRS): SARS Coronavirus 2: NEGATIVE

## 2019-02-19 MED ORDER — REGADENOSON 0.4 MG/5ML IV SOLN
INTRAVENOUS | Status: AC
  Filled 2019-02-19: qty 5

## 2019-02-19 MED ORDER — REGADENOSON 0.4 MG/5ML IV SOLN
0.4000 mg | Freq: Once | INTRAVENOUS | Status: AC
  Administered 2019-02-19: 0.4 mg via INTRAVENOUS
  Filled 2019-02-19: qty 5

## 2019-02-19 MED ORDER — TECHNETIUM TC 99M TETROFOSMIN IV KIT
10.4000 | PACK | Freq: Once | INTRAVENOUS | Status: AC | PRN
  Administered 2019-02-19: 10.4 via INTRAVENOUS

## 2019-02-19 MED ORDER — TECHNETIUM TC 99M TETROFOSMIN IV KIT
10.4000 | PACK | Freq: Once | INTRAVENOUS | Status: AC | PRN
  Administered 2019-02-19: 30.4 via INTRAVENOUS

## 2019-02-19 NOTE — Plan of Care (Signed)
  Problem: Education: Goal: Knowledge of General Education information will improve Description: Including pain rating scale, medication(s)/side effects and non-pharmacologic comfort measures Outcome: Progressing   Problem: Health Behavior/Discharge Planning: Goal: Ability to manage health-related needs will improve Outcome: Progressing   Problem: Clinical Measurements: Goal: Ability to maintain clinical measurements within normal limits will improve Outcome: Progressing Goal: Will remain free from infection Outcome: Not Applicable Goal: Diagnostic test results will improve Outcome: Progressing Goal: Respiratory complications will improve Outcome: Completed/Met Goal: Cardiovascular complication will be avoided Outcome: Progressing   Problem: Activity: Goal: Risk for activity intolerance will decrease Outcome: Completed/Met   Problem: Nutrition: Goal: Adequate nutrition will be maintained Outcome: Completed/Met   Problem: Coping: Goal: Level of anxiety will decrease Outcome: Completed/Met   Problem: Elimination: Goal: Will not experience complications related to bowel motility Outcome: Completed/Met Goal: Will not experience complications related to urinary retention Outcome: Completed/Met   Problem: Pain Managment: Goal: General experience of comfort will improve Outcome: Progressing   Problem: Safety: Goal: Ability to remain free from injury will improve Outcome: Completed/Met   Problem: Skin Integrity: Goal: Risk for impaired skin integrity will decrease Outcome: Completed/Met   Problem: Education: Goal: Understanding of cardiac disease, CV risk reduction, and recovery process will improve Outcome: Progressing Goal: Individualized Educational Video(s) Outcome: Not Applicable   Problem: Activity: Goal: Ability to tolerate increased activity will improve Outcome: Completed/Met   Problem: Cardiac: Goal: Ability to achieve and maintain adequate  cardiovascular perfusion will improve Outcome: Progressing   Problem: Health Behavior/Discharge Planning: Goal: Ability to safely manage health-related needs after discharge will improve Outcome: Progressing

## 2019-02-19 NOTE — Progress Notes (Signed)
Subjective:  Patient denies any anginal chest pain or shortness of breath seen in nuclear medicine department earlier today tolerated the stress portion of Lexiscan with no EKG changes.  Myoview scan showed mild reversible mid anterior wall ischemia with EF of 49%  Objective:  Vital Signs in the last 24 hours: Temp:  [98 F (36.7 C)-98.1 F (36.7 C)] 98.1 F (36.7 C) (12/31 1312) Pulse Rate:  [56-91] 66 (12/31 1312) Resp:  [12-20] 20 (12/31 0507) BP: (120-165)/(73-116) 150/89 (12/31 1312) SpO2:  [99 %-100 %] 100 % (12/31 1312) Weight:  [109.8 kg] 109.8 kg (12/30 2336)  Intake/Output from previous day: 12/30 0701 - 12/31 0700 In: 334.5 [I.V.:334.5] Out: -  Intake/Output from this shift: Total I/O In: 496.9 [P.O.:360; I.V.:136.9] Out: -   Physical Exam: Exam unchanged  Lab Results: Recent Labs    02/18/19 1440 02/19/19 0030  WBC 4.6 6.7  HGB 9.0* 8.8*  PLT 305 284   Recent Labs    02/18/19 1440 02/19/19 0030  NA 137 140  K 3.7 3.3*  CL 104 108  CO2 26 24  GLUCOSE 88 105*  BUN 10 10  CREATININE 0.88 0.82   No results for input(s): TROPONINI in the last 72 hours.  Invalid input(s): CK, MB Hepatic Function Panel No results for input(s): PROT, ALBUMIN, AST, ALT, ALKPHOS, BILITOT, BILIDIR, IBILI in the last 72 hours. Recent Labs    02/19/19 0030  CHOL 160   No results for input(s): PROTIME in the last 72 hours.  Imaging: Imaging results have been reviewed and DG Chest 2 View  Result Date: 02/18/2019 CLINICAL DATA:  43 year old female with chest pain. EXAM: CHEST - 2 VIEW COMPARISON:  Chest radiograph dated 02/17/2019. FINDINGS: The heart size and mediastinal contours are within normal limits. Both lungs are clear. The visualized skeletal structures are unremarkable. IMPRESSION: No active cardiopulmonary disease. Electronically Signed   By: Anner Crete M.D.   On: 02/18/2019 14:42   DG Chest 2 View  Result Date: 02/17/2019 CLINICAL DATA:  Chest pain  and shortness of breath for approximately 10 days. EXAM: CHEST - 2 VIEW COMPARISON:  05/17/2018 FINDINGS: The heart size and mediastinal contours are within normal limits. Both lungs are clear. The visualized skeletal structures are unremarkable. IMPRESSION: No active cardiopulmonary disease. Electronically Signed   By: Marlaine Hind M.D.   On: 02/17/2019 21:46   CTA Chest for PE  Result Date: 02/18/2019 CLINICAL DATA:  Chest pain, shortness of breath, recent UFE EXAM: CT ANGIOGRAPHY CHEST WITH CONTRAST TECHNIQUE: Multidetector CT imaging of the chest was performed using the standard protocol during bolus administration of intravenous contrast. Multiplanar CT image reconstructions and MIPs were obtained to evaluate the vascular anatomy. CONTRAST:  166mL OMNIPAQUE IOHEXOL 350 MG/ML SOLN COMPARISON:  Chest radiographs dated 02/18/2019 FINDINGS: Cardiovascular: Satisfactory opacification of the bilateral pulmonary arteries to the segmental level. No evidence of pulmonary embolism. No evidence of thoracic aortic aneurysm or dissection. Mild cardiomegaly.  No pericardial effusion. Mediastinum/Nodes: No suspicious mediastinal lymphadenopathy. Visualized thyroid is unremarkable. Lungs/Pleura: Lungs are clear. No suspicious pulmonary nodules. No focal consolidation. No pleural effusion or pneumothorax. Upper Abdomen: Visualized upper abdomen is notable for a central right hepatic cysts measuring up to 2.9 cm and bilateral renal cysts measuring up to 4.8 cm in the right upper pole. Musculoskeletal: Mild degenerative changes of the visualized thoracolumbar spine. Review of the MIP images confirms the above findings. IMPRESSION: No evidence of pulmonary embolism. No evidence of acute cardiopulmonary disease. Electronically Signed  By: Julian Hy M.D.   On: 02/18/2019 19:08   NM Myocar Multi W/Spect Tamela Oddi Motion / EF  Result Date: 02/19/2019 CLINICAL DATA:  Chest pain.  Acute aortic syndrome suspected. EXAM:  MYOCARDIAL IMAGING WITH SPECT (REST AND PHARMACOLOGIC-STRESS) GATED LEFT VENTRICULAR WALL MOTION STUDY LEFT VENTRICULAR EJECTION FRACTION TECHNIQUE: Standard myocardial SPECT imaging was performed after resting intravenous injection of 10 mCi Tc-86m tetrofosmin. Subsequently, intravenous infusion of Lexiscan was performed under the supervision of the Cardiology staff. At peak effect of the drug, 30 mCi Tc-31m tetrofosmin was injected intravenously and standard myocardial SPECT imaging was performed. Quantitative gated imaging was also performed to evaluate left ventricular wall motion, and estimate left ventricular ejection fraction. COMPARISON:  CT of the chest of 1 day prior. FINDINGS: Perfusion: Mild rest defect involving the mid anteroseptal wall, possibly due to soft tissue attenuation artifact. With stress, there is an area of superimposed moderate reversibility involving the mid anterior wall, medium in size. Wall Motion: Global mild hypokinesis. Left Ventricular Ejection Fraction: 49 % End diastolic volume 0000000 ml End systolic volume 70 ml IMPRESSION: 1. An area of reversibility involving the mid anterior wall, suspicious for inducible ischemia. 2. Mild global hypokinesis. 3. Left ventricular ejection fraction 49% 4. Non invasive risk stratification*: Intermediate *2012 Appropriate Use Criteria for Coronary Revascularization Focused Update: J Am Coll Cardiol. [id number]. http://content.airportbarriers.com.aspx?articleid=[email or web address] These results will be called to the ordering clinician or representative by the Radiologist Assistant, and communication documented in the PACS or zVision Dashboard. Electronically Signed   By: Abigail Miyamoto M.D.   On: 02/19/2019 14:48    Cardiac Studies:  Assessment/Plan:  Atypical chest pain with some features worrisome for angina MI ruled out/mildly positive Lexiscan Myoview as above New onset hypertension Morbid obesity Strong family history of coronary artery  disease Chronic anemia History of uterine fibroids status post recent embolization Plan Discussed with patient regarding nuclear stress test results and various options of treatment i.e. noninvasive CT angio versus left cardiac catheterization possible PTCA stenting versus benefits and agrees for noninvasive CT angio first.  LOS: 1 day    Charolette Forward 02/19/2019, 3:51 PM

## 2019-02-19 NOTE — Plan of Care (Signed)
POC initiated 

## 2019-02-19 NOTE — Progress Notes (Signed)
Cardiac CTA ordered by Harwani.  Will be performed on 02/20/19 at [facility].  [facility] transport arrangements made for 8:30a on 02/20/19 to be round trip.  Pt to have 18g in [facility] No caffeine after midnight NPO 4 hr prior to test, sips/chips w/ meds okay.  Please call with any questions. Marchia Bond RN Navigator Cardiac Imaging [facility] [phone] Cell

## 2019-02-20 ENCOUNTER — Ambulatory Visit (HOSPITAL_COMMUNITY)
Admit: 2019-02-20 | Discharge: 2019-02-20 | Disposition: A | Discharge status: Home / Self Care | Payer: BLUE CROSS/BLUE SHIELD | Attending: Cardiology | Admitting: Cardiology

## 2019-02-20 DIAGNOSIS — R079 Chest pain, unspecified: Secondary | ICD-10-CM | POA: Diagnosis not present

## 2019-02-20 MED ORDER — ASPIRIN 81 MG PO CHEW
81.0000 mg | CHEWABLE_TABLET | Freq: Once | ORAL | Status: AC
  Administered 2019-02-20: 81 mg via ORAL
  Filled 2019-02-20: qty 1

## 2019-02-20 MED ORDER — IOHEXOL 350 MG/ML SOLN
80.0000 mL | Freq: Once | INTRAVENOUS | Status: AC | PRN
  Administered 2019-02-20: 80 mL via INTRAVENOUS

## 2019-02-20 MED ORDER — NITROGLYCERIN 0.4 MG SL SUBL
SUBLINGUAL_TABLET | SUBLINGUAL | Status: AC
  Filled 2019-02-20: qty 2

## 2019-02-20 MED ORDER — AMLODIPINE BESYLATE 5 MG PO TABS: 5.0000 mg | ORAL_TABLET | Freq: Every day | ORAL | 3 refills | Status: AC

## 2019-02-20 NOTE — Discharge Summary (Signed)
Discharge summary dictated on 02/20/2019 dictation number is [phone]

## 2019-02-20 NOTE — Discharge Instructions (Signed)
Angina  Angina is extreme discomfort in the chest, neck, arm, jaw, or back. The discomfort is caused by a lack of blood in the middle layer of the heart wall (myocardium). There are four types of angina:  Stable angina. This is triggered by vigorous activity or exercise. It goes away when you rest or take angina medicine.  Unstable angina. This is a warning sign and can lead to a heart attack (acute coronary syndrome). This is a medical emergency. Symptoms come at rest and last a long time.  Microvascular angina. This affects the small coronary arteries. Symptoms include feeling tired and being short of breath.  Prinzmetal or variant angina. This is caused by a tightening (spasm) of the arteries that go to your heart. What are the causes? This condition is caused by atherosclerosis. This is the buildup of fat and cholesterol (plaque) in your arteries. The plaque may narrow or block the artery. Other causes of angina include:  Sudden tightening of the muscles of the arteries in the heart (coronary spasm).  Small artery disease (microvascular dysfunction).  Problems with any of your heart valves (heart valve disease).  A tear in an artery in your heart (coronary artery dissection).  Diseases of the heart muscle (cardiomyopathy), or other heart diseases. What increases the risk? You are more likely to develop this condition if you have:  High cholesterol.  High blood pressure (hypertension).  Diabetes.  A family history of heart disease.  An inactive (sedentary) lifestyle, or you do not exercise enough.  Depression.  Had radiation treatment to the left side of your chest. Other risk factors include:  Using tobacco.  Being obese.  Eating a diet high in saturated fats.  Being exposed to high stress or triggers of stress.  Using drugs, such as cocaine. Women have a greater risk for angina if:  They are older than 55.  They have gone through menopause (are  postmenopausal). What are the signs or symptoms? Common symptoms of this condition in both men and women may include:  Chest pain, which may: ? Feel like a crushing or squeezing in the chest, or like a tightness, pressure, fullness, or heaviness in the chest. ? Last for more than a few minutes at a time, or it may stop and come back (recur) over the course of a few minutes.  Pain in the neck, arm, jaw, or back.  Unexplained heartburn or indigestion.  Shortness of breath.  Nausea.  Sudden cold sweats. Women and people with diabetes may have unusual (atypical) symptoms, such as:  Fatigue.  Unexplained feelings of nervousness or anxiety.  Unexplained weakness.  Dizziness or fainting. How is this diagnosed? This condition may be diagnosed based on:  Your symptoms and medical history.  Electrocardiogram (ECG) to measure the electrical activity in your heart.  Blood tests.  Stress test to look for signs of blockage when your heart is stressed.  CT angiogram to examine your heart and the blood flow to it.  Coronary angiogram to check your coronary arteries for blockage. How is this treated? Angina may be treated with:  Medicines to: ? Prevent blood clots and heart attack. ? Relax blood vessels and improve blood flow to the heart (nitrates). ? Reduce blood pressure, improve the pumping action of the heart, and relax blood vessels that are spasming. ? Reduce cholesterol and help treat atherosclerosis.  A procedure to widen a narrowed or blocked coronary artery (angioplasty). A mesh tube may be placed in a coronary artery to   keep it open (coronary stenting).  Surgery to allow blood to go around a blocked artery (coronary artery bypass surgery). Follow these instructions at home: Medicines  Take over-the-counter and prescription medicines only as told by your health care provider.  Do not take the following medicines unless your health care provider approves: ? NSAIDs,  such as ibuprofen or naproxen. ? Vitamin supplements that contain vitamin A, vitamin E, or both. ? Hormone replacement therapy that contains estrogen with or without progestin. Eating and drinking   Eat a heart-healthy diet. This includes plenty of fresh fruits and vegetables, whole grains, low-fat (lean) protein, and low-fat dairy products.  Follow instructions from your health care provider about eating or drinking restrictions. Activity  Follow an exercise program approved by your health care provider.  Consider joining a cardiac rehabilitation program.  Take a break when you feel fatigued. Plan rest periods in your daily activities. Lifestyle   Do not use any products that contain nicotine or tobacco, such as cigarettes, e-cigarettes, and chewing tobacco. If you need help quitting, ask your health care provider.  If your health care provider says you can drink alcohol: ? Limit how much you use to:  0-1 drink a day for nonpregnant women.  0-2 drinks a day for men. ? Be aware of how much alcohol is in your drink. In the [place] one drink equals one 12 oz bottle of beer (355 mL), one 5 oz glass of wine (148 mL), or one 1 oz glass of hard liquor (44 mL). General instructions  Maintain a healthy weight.  Learn to manage stress.  Keep your vaccinations up to date. Get the flu (influenza) vaccine every year.  Talk to your health care provider if you feel depressed. Take a depression screening test to see if you are at risk for depression.  Work with your health care provider to manage other health conditions, such as hypertension or diabetes.  Keep all follow-up visits as told by your health care provider. This is important. Get help right away if:  You have pain in your chest, neck, arm, jaw, or back, and the pain: ? Lasts more than a few minutes. ? Is recurring. ? Is not relieved by taking medicines under the tongue (sublingual nitroglycerin). ? Increases in intensity or  frequency.  You have a lot of sweating without cause.  You have unexplained: ? Heartburn or indigestion. ? Shortness of breath or difficulty breathing. ? Nausea or vomiting. ? Fatigue. ? Feelings of nervousness or anxiety. ? Weakness.  You have sudden light-headedness or dizziness.  You faint. These symptoms may represent a serious problem that is an emergency. Do not wait to see if the symptoms will go away. Get medical help right away. Call your local emergency services (911 in the [place] Do not drive yourself to the hospital. Summary  Angina is extreme discomfort in the chest, neck, arm, jaw, or back that is caused by a lack of blood in the heart wall.  There are many symptoms of angina. They include chest pain, unexplained heartburn or indigestion, sudden cold sweats, and fatigue.  Angina may be treated with behavioral changes, medicine, or surgery.  Symptoms of angina may represent an emergency. Get medical help right away. Call your local emergency services (911 in the [place] Do not drive yourself to the hospital. This information is not intended to replace advice given to you by your health care provider. Make sure you discuss any questions you have with your health care provider.   Document Revised: 09/23/2017 Document Reviewed: 09/23/2017 Elsevier Patient Education  2020 [employer]  

## 2019-02-23 NOTE — Progress Notes (Signed)
Patient called back 02/23/19 and explained that she was having shooting pain go down her forearm to her thumb. Patient stated this occurred after a gentleman form the IV team came and attempted to get IV access on her in preparation for a CTA. Patient stated that he was using an ultrasound to attempt IV access and he attempted x2 without success. Patient then stated she requested for him to not try again and she wanted the IV to be inserted by someone at [facility] where she would be getting the CTA. Encouraged patient to follow up with her PCP for ongoing assessment of pain. Patient also requested this information be documented  In her chart in case the pain does not go away there is documentation of how issues started.

## 2019-02-25 NOTE — Discharge Summary (Signed)
NAME: Katherine Hutchinson, NICOSIA MEDICAL RECORD [id number] ACCOUNT [id number] DATE OF BIRTH:07/26/1975 FACILITY: [facility] LOCATION: [facility] PHYSICIAN:Mellony Danziger Daivd Council, MD  DISCHARGE SUMMARY  DATE OF DISCHARGE:  02/20/2019  ADMITTING DIAGNOSES: 1.  Atypical chest pain with some features worrisome for angina, rule out myocardial infarction. 2.  New onset hypertension. 3.  Morbid obesity. 4.  Family history of coronary artery disease. 5.  Chronic anemia. 6.  History of uterine fibroids, status post recent embolization.  DISCHARGE DIAGNOSES: 1.  Atypical chest pain, myocardial infarction ruled out.  Mildly positive Lexiscan Myoview followed by a normal CT coronary angiography with a calcium score of 0. 2.  Hypertension. 3.  Morbid obesity. 4.  Strong family history of coronary artery disease. 5.  Chronic anemia. 6.  History of uterine fibroids, status post recent embolization.  DISCHARGE HOME MEDICATIONS: 1.  Amlodipine 5 mg daily. 2.  Ferrous sulfate 325 mg 1 tablet daily. 3.  Omeprazole 40 mg daily. 4.  Vitamin D 50,000 units 1 capsule every week.  DIET:  Low-salt, low-cholesterol, weight-reducing diet.  ACTIVITY:  As tolerated.  CONDITION AT DISCHARGE:  Stable.  FOLLOWUP:  With me in 2 weeks.  BRIEF HISTORY AND HOSPITAL COURSE:  The patient is a 44 year old female with past medical history significant for chronic anemia, history of fibroid tumor of the uterus, status post recent embolization, morbid obesity, strong family history of coronary  artery disease.  Brother died at the age of 59 due to massive MI complicated by CHF.  Recently had embolization of the uterine artery of fibroid tumor as above.  She came to the ER complaining of recurrent retrosternal chest pain described as  stabbing/dilating associated with mild shortness of breath and nausea off and on for the last 10 days.  States chest pain was anywhere between grade I to II, 4/10, lasting a few seconds to minutes,  occasionally radiating to left arm.  Denies any  diaphoresis.  Denies PND, orthopnea, leg swelling.  Denies palpitation, lightheadedness or syncope, but complains of occasional skipping of the heartbeat.  The patient denies any recent cardiac workup.  States her brother passed away due to massive MI  complicated by CHF at the age of 56.  States had appointment to see a cardiologist at [facility] in mid-January.  The patient was seen at [facility] ED last night and was noted to have minimally elevated troponin I.  The patient left before seeing the MD  because of the long wait.  Due to recurrent chest pain, she decided to come to the ED at [facility].  The patient presently denies any chest pain, denies any shortness of breath.  The patient was noted to have elevated blood pressures.  States  has not taken any blood pressure medicines in the past.  The patient had repeat high-sensitivity troponin I which a normal EKG done in the ED showed normal sinus rhythm with LVH with strain pattern.  The patient also had CT of the chest which showed no  evidence of pulmonary embolism or dissection.  PHYSICAL EXAMINATION: GENERAL:  She was alert, awake, oriented x3. VITAL SIGNS:  Blood pressure was 140/100, pulse 57.  She was afebrile. HEENT:  Conjunctivae pink. NECK:  Supple, no JVD, no bruit. LUNGS:  Clear to auscultation without rhonchi or rales. CARDIOVASCULAR:  S1, S2 was normal.  She was bradycardic as there was soft systolic murmur.  No S3 gallop. ABDOMEN:  Soft.  Bowel sounds are present, nontender. EXTREMITIES:  There is no clubbing, cyanosis  or edema.  LABORATORY DATA:  Her sodium was 139, potassium 3.4.  Repeat potassium was 3.7.  BUN ____, creatinine 0.85, glucose was 90.  Four sets of troponin I initially was 19, 20 which are minimally elevated.  Repeat high-sensitivity troponin I was ____.  Her  hemoglobin was 8.2, hematocrit 28.5, white count of 5.6.  Repeat hemoglobin was 9, hematocrit  31.7, white count of 4.6, which has been stable.  Nuclear stress test showed area of reversibility involving the ____ suspicious for inducible ischemia and mild  ____ hypokinesia, EF of 49%.  Noninvasive risk stratification intermediate.  The patient subsequently underwent a CT coronary angiography which showed a coronary calcium score of 0.  This was zero percentile for age and sex-matched control. There were normal coronary artery origins with right coronary artery dominance.  No evidence of CAD.  BRIEF HOSPITAL COURSE:  The patient was admitted to telemetry unit.  MI was ruled out by serial enzymes and EKG.  The patient subsequently underwent Lexiscan Myoview stress test which showed mid anterior wall ischemia as above.  The patient was discussed  at length regarding further management; i.e., noninvasive CT angio of the coronary arteries versus left cardiac catheterization.  Agreed to proceed with CT coronary angiography, which showed no evidence of atherosclerotic plaque with also calcium score  of 0.  The patient did not have any exertional chest pain during the hospital stay.  The patient will be discharged home on above medication and will be followed up in my office in 2 weeks.  We will get ____ with further workup for her chest pain as  outpatient.  LN/NUANCE D:02/20/2019 T:02/21/2019 JOB:[id number]

## 2020-11-09 ENCOUNTER — Emergency Department (HOSPITAL_BASED_OUTPATIENT_CLINIC_OR_DEPARTMENT_OTHER)
Admission: EM | Admit: 2020-11-09 | Discharge: 2020-11-09 | Disposition: A | Discharge status: Home / Self Care | Payer: PRIVATE HEALTH INSURANCE | Attending: Emergency Medicine | Admitting: Emergency Medicine

## 2020-11-09 ENCOUNTER — Encounter (HOSPITAL_BASED_OUTPATIENT_CLINIC_OR_DEPARTMENT_OTHER): Payer: Self-pay | Admitting: *Deleted

## 2020-11-09 ENCOUNTER — Emergency Department (HOSPITAL_BASED_OUTPATIENT_CLINIC_OR_DEPARTMENT_OTHER): Payer: PRIVATE HEALTH INSURANCE

## 2020-11-09 ENCOUNTER — Other Ambulatory Visit: Payer: Self-pay

## 2020-11-09 DIAGNOSIS — R0981 Nasal congestion: Secondary | ICD-10-CM

## 2020-11-09 DIAGNOSIS — R519 Headache, unspecified: Secondary | ICD-10-CM | POA: Insufficient documentation

## 2020-11-09 DIAGNOSIS — E876 Hypokalemia: Secondary | ICD-10-CM

## 2020-11-09 DIAGNOSIS — G4452 New daily persistent headache (NDPH): Secondary | ICD-10-CM

## 2020-11-09 DIAGNOSIS — R1012 Left upper quadrant pain: Secondary | ICD-10-CM | POA: Insufficient documentation

## 2020-11-09 DIAGNOSIS — K219 Gastro-esophageal reflux disease without esophagitis: Secondary | ICD-10-CM | POA: Insufficient documentation

## 2020-11-09 DIAGNOSIS — R1010 Upper abdominal pain, unspecified: Secondary | ICD-10-CM

## 2020-11-09 DIAGNOSIS — R109 Unspecified abdominal pain: Secondary | ICD-10-CM | POA: Diagnosis present

## 2020-11-09 DIAGNOSIS — R1013 Epigastric pain: Secondary | ICD-10-CM | POA: Diagnosis not present

## 2020-11-09 LAB — COMPREHENSIVE METABOLIC PANEL
ALT: 10 U/L (ref 0–44)
AST: 12 U/L — ABNORMAL LOW (ref 15–41)
Albumin: 4.3 g/dL (ref 3.5–5.0)
Alkaline Phosphatase: 45 U/L (ref 38–126)
Anion gap: 11 (ref 5–15)
BUN: 13 mg/dL (ref 6–20)
CO2: 24 mmol/L (ref 22–32)
Calcium: 10.6 mg/dL — ABNORMAL HIGH (ref 8.9–10.3)
Chloride: 101 mmol/L (ref 98–111)
Creatinine, Ser: 0.99 mg/dL (ref 0.44–1.00)
GFR, Estimated: >60 mL/min (ref >60)
Glucose, Bld: 101 mg/dL — ABNORMAL HIGH (ref 70–99)
Potassium: 3 mmol/L — ABNORMAL LOW (ref 3.5–5.1)
Sodium: 136 mmol/L (ref 135–145)
Total Bilirubin: 0.6 mg/dL (ref 0.3–1.2)
Total Protein: 8 g/dL (ref 6.5–8.1)

## 2020-11-09 LAB — URINALYSIS, ROUTINE W REFLEX MICROSCOPIC
Bilirubin Urine: NEGATIVE
Glucose, UA: NEGATIVE mg/dL
Ketones, ur: NEGATIVE mg/dL
Leukocytes,Ua: NEGATIVE
Nitrite: NEGATIVE
Specific Gravity, Urine: 1.023 (ref 1.005–1.030)
pH: 5.5 (ref 5.0–8.0)

## 2020-11-09 LAB — CBC
HCT: 38.7 % (ref 36.0–46.0)
Hemoglobin: 12.8 g/dL (ref 12.0–15.0)
MCH: 27.1 pg (ref 26.0–34.0)
MCHC: 33.1 g/dL (ref 30.0–36.0)
MCV: 82 fL (ref 80.0–100.0)
Platelets: 282 10*3/uL (ref 150–400)
RBC: 4.72 MIL/uL (ref 3.87–5.11)
RDW: 14.7 % (ref 11.5–15.5)
WBC: 4.9 10*3/uL (ref 4.0–10.5)
nRBC: 0 % (ref 0.0–0.2)

## 2020-11-09 LAB — LIPASE, BLOOD: Lipase: 18 U/L (ref 11–51)

## 2020-11-09 LAB — PREGNANCY, URINE: Preg Test, Ur: NEGATIVE

## 2020-11-09 MED ORDER — POTASSIUM CHLORIDE CRYS ER 20 MEQ PO TBCR
40.0000 meq | EXTENDED_RELEASE_TABLET | Freq: Once | ORAL | Status: AC
  Administered 2020-11-09: 40 meq via ORAL
  Filled 2020-11-09: qty 2

## 2020-11-09 MED ORDER — SODIUM CHLORIDE 0.9 % IV BOLUS
500.0000 mL | Freq: Once | INTRAVENOUS | Status: AC
  Administered 2020-11-09: 500 mL via INTRAVENOUS

## 2020-11-09 MED ORDER — IOHEXOL 350 MG/ML SOLN
80.0000 mL | Freq: Once | INTRAVENOUS | Status: AC | PRN
  Administered 2020-11-09: 80 mL via INTRAVENOUS

## 2020-11-09 MED ORDER — POTASSIUM CHLORIDE CRYS ER 20 MEQ PO TBCR: 20.0000 meq | EXTENDED_RELEASE_TABLET | Freq: Every day | ORAL | 0 refills | Status: AC

## 2020-11-09 NOTE — ED Notes (Signed)
ED Provider at bedside. Harris PA.

## 2020-11-09 NOTE — ED Provider Notes (Signed)
[facility] EMERGENCY DEPT Provider Note   CSN: [id number] Arrival date & time: 11/09/20  1211     History Chief Complaint  Patient presents with   Abdominal Pain    Katherine Hutchinson is a 45 y.o. female who presents with multiple complaints  1) patient reports that she developed a headache 1 week ago on the left side which she describes as throbbing.  He has been present for a full week.  She went to an urgent care where she was diagnosed with sinusitis and was prescribed azithromycin but only took 1 dose.  Patient reports that her sister told her it might be her blood pressure and she took her blood pressure which was actually low at home.  She followed up with her primary care physician who did an extensive work-up.  She continues to have the headache but it is very low-grade at this time.  She took lisinopril of her own accord which was her sisters because she thought it might be her blood pressure.  She denies any other neurologic symptoms, fevers, chills, neck stiffness, rash. 2) patient  complains of epigastric and left upper quadrant abdominal pain which she describes as burning and radiating across her entire upper abdomen.  The pain is colicky, waxing and waning and moderate.  She has a history of reflux and has been on omeprazole for the past week as prescribed by her PCP when she saw him several days ago.  She has had 1 episode of vomiting "frothy spit."  She also has associated constipation.  She denies diarrhea, melena, hematochezia, hemoptysis.  Tested herself for COVID and it was negative.   Abdominal Pain     Past Medical History:  Diagnosis Date   Anemia    Chlamydia    Fibroids    uterine   FIBROIDS, UTERUS 06/04/2008   Qualifier: Diagnosis of  By: [name], [name]     Genital warts 03/06/2011   H/O right breast biopsy    benign   History of blood transfusion    after d&c   Morbid obesity ([facility] [place])    Obesity    Uterus, adenomyosis 01/17/2011    Suggested by transvaginal US in 09/2010     Patient Active Problem List   Diagnosis Date Noted   Dysphagia, pharyngoesophageal phase 12/31/2013   Hematochezia 01/28/2013   Chest pain 09/25/2012   Anxiety state, unspecified 09/25/2012   SOB (shortness of breath) 09/18/2012   GERD (gastroesophageal reflux disease) 09/18/2012   Macular rash 11/07/2011   Drug rash 06/14/2011   Irregular periods 04/04/2011   Menorrhagia with regular cycle 11/09/2010   Chronic back pain 11/09/2010   Preventative health care 11/09/2010   Obesity 11/09/2010   Microcytic anemia 05/11/2008    Past Surgical History:  Procedure Laterality Date   BREAST EXCISIONAL BIOPSY Right 03/2004   BREAST LUMPECTOMY     benign   DILATION AND CURETTAGE, DIAGNOSTIC / THERAPEUTIC     DILATION AND EVACUATION  04/19/2011   Procedure: DILATATION AND EVACUATION;  Surgeon: Guss Bunde, MD;  Location: [facility];  Service: Gynecology;  Laterality: N/A;   TOOTH EXTRACTION  02/22/2014     OB History     Gravida  6   Para  3   Term  3   Preterm      AB  2   Living  3      SAB      IAB  2   Ectopic  Multiple      Live Births              Family History  Problem Relation Age of Onset   Hypertension Mother    Colon polyps Mother    Hypertension Sister    Heart disease Brother    Hypertension Father    Stroke Maternal Aunt    Hypertension Maternal Grandmother    Colon cancer Maternal Grandfather    Anesthesia problems Neg Hx    Stomach cancer Neg Hx     Social History   Tobacco Use   Smoking status: Never   Smokeless tobacco: Never  Vaping Use   Vaping Use: Never used  Substance Use Topics   Alcohol use: No   Drug use: No    Comment: Pt admits to h/o marijuana use    Home Medications Prior to Admission medications   Medication Sig Start Date End Date Taking? Authorizing Provider  amLODipine (NORVASC) 5 MG tablet Take 1 tablet (5 mg total) by mouth daily. 02/21/19  Yes Charolette Forward, MD  omeprazole (PRILOSEC) 40 MG capsule Take 40 mg by mouth daily. 12/07/18  Yes [provider]  Vitamin D, Ergocalciferol, (DRISDOL) 1.25 MG (50000 UT) CAPS capsule Take 50,000 Units by mouth once a week. 01/29/19  Yes [provider]  ferrous sulfate 325 (65 FE) MG EC tablet Take 325 mg by mouth daily with breakfast.    [provider]    Allergies    Shellfish-derived products and Diflucan [fluconazole]  Review of Systems   Review of Systems  Gastrointestinal:  Positive for abdominal pain.  Ten systems reviewed and are negative for acute change, except as noted in the HPI.   Physical Exam Updated Vital Signs BP 113/86   Pulse 63   Temp 98.1 F (36.7 C) (Oral)   Resp 17   Ht 5\' 5"  (1.651 m)   Wt 101.6 kg   SpO2 100%   BMI 37.28 kg/m   Physical Exam Vitals and nursing note reviewed.  Constitutional:      General: She is not in acute distress.    Appearance: She is well-developed. She is not diaphoretic.  HENT:     Head: Normocephalic and atraumatic.     Nose: Nose normal.     Mouth/Throat:     Mouth: Mucous membranes are moist.  Eyes:     General: No scleral icterus.    Conjunctiva/sclera: Conjunctivae normal.     Pupils: Pupils are equal, round, and reactive to light.     Comments: No horizontal, vertical or rotational nystagmus  Neck:     Comments: Full active and passive ROM without pain No midline or paraspinal tenderness No nuchal rigidity or meningeal signs Cardiovascular:     Rate and Rhythm: Normal rate and regular rhythm.  Pulmonary:     Effort: Pulmonary effort is normal. No respiratory distress.     Breath sounds: No wheezing or rales.  Abdominal:     General: Bowel sounds are normal. There is no distension.     Palpations: Abdomen is soft.     Tenderness: There is abdominal tenderness in the epigastric area and left upper quadrant. There is no guarding or rebound.  Musculoskeletal:        General: Normal range of  motion.     Cervical back: Normal range of motion and neck supple.  Lymphadenopathy:     Cervical: No cervical adenopathy.  Skin:    General: Skin is warm and  dry.     Findings: No rash.  Neurological:     Mental Status: She is alert and oriented to person, place, and time.     Cranial Nerves: No cranial nerve deficit.     Motor: No abnormal muscle tone.     Coordination: Coordination normal.     Comments: Mental Status:  Alert, oriented, thought content appropriate. Speech fluent without evidence of aphasia. Able to follow 2 step commands without difficulty.  Cranial Nerves:  II:  Peripheral visual fields grossly normal, pupils equal, round, reactive to light III,IV, VI: ptosis not present, extra-ocular motions intact bilaterally  V,VII: smile symmetric, facial light touch sensation equal VIII: hearing grossly normal bilaterally  IX,X: midline uvula rise  XI: bilateral shoulder shrug equal and strong XII: midline tongue extension  Motor:  5/5 in upper and lower extremities bilaterally including strong and equal grip strength and dorsiflexion/plantar flexion Sensory: Pinprick and light touch normal in all extremities.  Cerebellar: normal finger-to-nose with bilateral upper extremities Gait: normal gait and balance CV: distal pulses palpable throughout   Psychiatric:        Behavior: Behavior normal.        Thought Content: Thought content normal.        Judgment: Judgment normal.    ED Results / Procedures / Treatments   Labs (all labs ordered are listed, but only abnormal results are displayed) Labs Reviewed  CBC  LIPASE, BLOOD  COMPREHENSIVE METABOLIC PANEL  URINALYSIS, ROUTINE W REFLEX MICROSCOPIC  PREGNANCY, URINE    EKG None  Radiology No results found.  Procedures Procedures   Medications Ordered in ED Medications - No data to display  ED Course  I have reviewed the triage vital signs and the nursing notes.  Pertinent labs & imaging results that were  available during my care of the patient were reviewed by me and considered in my medical decision making (see chart for details).    MDM Rules/Calculators/A&P                          Patient here with multiple complaints. Regarding the patient's headache she has a normal neurologic examination, I ordered and reviewed images of a head CT which showed no acute abnormalities.  There is no evidence of space containing lesion, no focal neurologic deficits, no evidence of meningitis or other emergent cause of her symptoms. .  Patient also complaining of epigastric and left upper quadrant abdominal pain.  I ordered and reviewed labs and included CA BC without elevated white blood cell count, CMP with mild hypokalemia of insignificant value, urinalysis shows no evidence of infection, negative urine pregnancy test.  I ordered and reviewed images of a CT abdomen and pelvis which show no acute abnormalities and show evidence of the patient's known polycystic kidney disease.  Patient has no active vomiting and is hemodynamically stable.  I doubt biliary colic, pulm for perforated gastric ulcer, diverticulitis, renal colic, left lower lobe pneumonia, ACS.  The patient appears otherwise appropriate for discharge at this time with close outpatient follow-up.  She was discharged with potassium tablets to take over the next few days.  Blood pressure is improved with a small bolus of fluid.  This may be contributing to her headache.  She does not appear to have any sinusitis.  I reviewed all findings with the patient at bedside.  Discussed return precautions Final Clinical Impression(s) / ED Diagnoses Final diagnoses:  None    Rx /  DC Orders ED Discharge Orders     None        Margarita Mail, PA-C 11/09/20 1541    Dorie Rank, MD 11/09/20 [phone]

## 2020-11-09 NOTE — ED Triage Notes (Signed)
Epigastric burning sensation radiating to left and back, last week headache times 8 days, went to PCP yesterday and was told Mag levels are low, she thinks it is due to dehydration. Has taken meds for bp, heart burn and Ibuprofen.

## 2020-11-09 NOTE — Discharge Instructions (Addendum)

## 2021-07-13 ENCOUNTER — Emergency Department (HOSPITAL_BASED_OUTPATIENT_CLINIC_OR_DEPARTMENT_OTHER): Payer: PRIVATE HEALTH INSURANCE | Admitting: Radiology

## 2021-07-13 ENCOUNTER — Emergency Department (HOSPITAL_BASED_OUTPATIENT_CLINIC_OR_DEPARTMENT_OTHER)
Admission: EM | Admit: 2021-07-13 | Discharge: 2021-07-13 | Disposition: A | Discharge status: Home / Self Care | Payer: PRIVATE HEALTH INSURANCE | Attending: Emergency Medicine | Admitting: Emergency Medicine

## 2021-07-13 ENCOUNTER — Other Ambulatory Visit: Payer: Self-pay

## 2021-07-13 ENCOUNTER — Encounter (HOSPITAL_BASED_OUTPATIENT_CLINIC_OR_DEPARTMENT_OTHER): Payer: Self-pay

## 2021-07-13 DIAGNOSIS — Q613 Polycystic kidney, unspecified: Secondary | ICD-10-CM | POA: Diagnosis not present

## 2021-07-13 DIAGNOSIS — R1012 Left upper quadrant pain: Secondary | ICD-10-CM | POA: Diagnosis present

## 2021-07-13 DIAGNOSIS — Z6838 Body mass index (BMI) 38.0-38.9, adult: Secondary | ICD-10-CM | POA: Diagnosis not present

## 2021-07-13 DIAGNOSIS — K219 Gastro-esophageal reflux disease without esophagitis: Secondary | ICD-10-CM | POA: Diagnosis not present

## 2021-07-13 DIAGNOSIS — E669 Obesity, unspecified: Secondary | ICD-10-CM | POA: Insufficient documentation

## 2021-07-13 DIAGNOSIS — Z79899 Other long term (current) drug therapy: Secondary | ICD-10-CM | POA: Diagnosis not present

## 2021-07-13 DIAGNOSIS — M546 Pain in thoracic spine: Secondary | ICD-10-CM | POA: Insufficient documentation

## 2021-07-13 LAB — MAGNESIUM: Magnesium: 1.6 mg/dL — ABNORMAL LOW (ref 1.7–2.4)

## 2021-07-13 LAB — LIPASE, BLOOD: Lipase: 34 U/L (ref 11–51)

## 2021-07-13 LAB — COMPREHENSIVE METABOLIC PANEL
ALT: 9 U/L (ref 0–44)
AST: 12 U/L — ABNORMAL LOW (ref 15–41)
Albumin: 3.6 g/dL (ref 3.5–5.0)
Alkaline Phosphatase: 40 U/L (ref 38–126)
Anion gap: 7 (ref 5–15)
BUN: 15 mg/dL (ref 6–20)
CO2: 25 mmol/L (ref 22–32)
Calcium: 9.4 mg/dL (ref 8.9–10.3)
Chloride: 105 mmol/L (ref 98–111)
Creatinine, Ser: 0.95 mg/dL (ref 0.44–1.00)
GFR, Estimated: >60 mL/min (ref >60)
Glucose, Bld: 80 mg/dL (ref 70–99)
Potassium: 3.6 mmol/L (ref 3.5–5.1)
Sodium: 137 mmol/L (ref 135–145)
Total Bilirubin: 0.3 mg/dL (ref 0.3–1.2)
Total Protein: 6.6 g/dL (ref 6.5–8.1)

## 2021-07-13 LAB — CBC
HCT: 30.9 % — ABNORMAL LOW (ref 36.0–46.0)
Hemoglobin: 9.6 g/dL — ABNORMAL LOW (ref 12.0–15.0)
MCH: 25.7 pg — ABNORMAL LOW (ref 26.0–34.0)
MCHC: 31.1 g/dL (ref 30.0–36.0)
MCV: 82.6 fL (ref 80.0–100.0)
Platelets: 224 10*3/uL (ref 150–400)
RBC: 3.74 MIL/uL — ABNORMAL LOW (ref 3.87–5.11)
RDW: 14.7 % (ref 11.5–15.5)
WBC: 5.8 10*3/uL (ref 4.0–10.5)
nRBC: 0 % (ref 0.0–0.2)

## 2021-07-13 LAB — URINALYSIS, ROUTINE W REFLEX MICROSCOPIC
Bilirubin Urine: NEGATIVE
Glucose, UA: NEGATIVE mg/dL
Ketones, ur: NEGATIVE mg/dL
Nitrite: NEGATIVE
Specific Gravity, Urine: 1.025 (ref 1.005–1.030)
pH: 6 (ref 5.0–8.0)

## 2021-07-13 LAB — PREGNANCY, URINE: Preg Test, Ur: NEGATIVE

## 2021-07-13 MED ORDER — ALUM & MAG HYDROXIDE-SIMETH 200-200-20 MG/5ML PO SUSP
30.0000 mL | Freq: Once | ORAL | Status: AC
  Administered 2021-07-13: 30 mL via ORAL
  Filled 2021-07-13: qty 30

## 2021-07-13 MED ORDER — LIDOCAINE VISCOUS HCL 2 % MT SOLN
15.0000 mL | Freq: Once | OROMUCOSAL | Status: DC
  Filled 2021-07-13: qty 15

## 2021-07-13 MED ORDER — LIDOCAINE VISCOUS HCL 2 % MT SOLN: 15.0000 mL | OROMUCOSAL | 0 refills | Status: AC | PRN

## 2021-07-13 MED ORDER — SUCRALFATE 1 G PO TABS: 1.0000 g | ORAL_TABLET | Freq: Three times a day (TID) | ORAL | 0 refills | Status: AC | PRN

## 2021-07-13 NOTE — ED Triage Notes (Signed)
Pt c/o LUQ abd pain x 2 weeks. Pt describes it as burning sensation. Pt also has had pain to her R sided back pain.

## 2021-07-13 NOTE — Discharge Instructions (Addendum)
You can try some over-the-counter Maalox, Mylanta, continue taking your omeprazole as prescribed.  Continue taking your iron supplements as well as magnesium.  I prescribed 2 new medications including Carafate, and viscous lidocaine that could help with the stomach pain.  If your symptoms significantly worsen or fail to improve please return for further evaluation probable imaging of the abdomen.  Please continue your follow-ups with your PCP, and gastroenterologist.  It was a pleasure taking care of you today, I hope that you feel better soon.

## 2021-07-13 NOTE — ED Notes (Signed)
RN provided AVS using Teachback Method. Patient verbalizes understanding of Discharge Instructions. Opportunity for Questioning and Answers were provided by RN. Patient Discharged from ED ambulatory to Home via Self.  

## 2021-07-13 NOTE — ED Provider Notes (Signed)
[facility] Provider Note   CSN: [id number] Arrival date & time: 07/13/21  1851     History  Chief Complaint  Patient presents with   Abdominal Pain    Katherine Hutchinson is a 46 y.o. female with past medical history significant for polycystic kidney disease, acid reflux, fibroids, obesity presents with concern for left upper quadrant abdominal pain for the last 2 weeks.  Patient reports a dull/burning sensation.  She has also had some pain of the right upper back which she attributes to her PKD reports is somewhat similar to her previous kidney related pain.  Patient reports some occasional nausea without significant vomiting.  Patient denies significant change of abdominal pain with eating.  Does not seem to be worse or better.  She reports it is somewhat different than her normal acid reflux related pain.  She denies fever, chills, pain waking her up from sleep.  She reports that she has had a history of hypokalemia, hypomagnesemia in the past.   Abdominal Pain     Home Medications Prior to Admission medications   Medication Sig Start Date End Date Taking? Authorizing Provider  lidocaine (XYLOCAINE) 2 % solution Use as directed 15 mLs in the mouth or throat as needed for mouth pain. 07/13/21  Yes Hilaria Titsworth H, PA-C  sucralfate (CARAFATE) 1 g tablet Take 1 tablet (1 g total) by mouth 3 (three) times daily as needed. 07/13/21  Yes Adriahna Shearman H, PA-C  amLODipine (NORVASC) 5 MG tablet Take 1 tablet (5 mg total) by mouth daily. 02/21/19   Charolette Forward, MD  ferrous sulfate 325 (65 FE) MG EC tablet Take 325 mg by mouth daily with breakfast.    [provider]  omeprazole (PRILOSEC) 40 MG capsule Take 40 mg by mouth daily. 12/07/18   [provider]  potassium chloride SA (KLOR-CON) 20 MEQ tablet Take 1 tablet (20 mEq total) by mouth daily. 11/09/20   Margarita Mail, PA-C  Vitamin D, Ergocalciferol, (DRISDOL) 1.25 MG (50000 UT) CAPS  capsule Take 50,000 Units by mouth once a week. 01/29/19   [provider]      Allergies    Shellfish-derived products and Diflucan [fluconazole]    Review of Systems   Review of Systems  Gastrointestinal:  Positive for abdominal pain.  All other systems reviewed and are negative.  Physical Exam Updated Vital Signs BP 129/69 (BP Location: Right Arm)   Pulse 99   Temp 98 F (36.7 C)   Resp 18   Ht '5\' 5"'$  (1.651 m)   Wt 105.2 kg   LMP 06/27/2021   SpO2 100%   BMI 38.61 kg/m  Physical Exam Vitals and nursing note reviewed.  Constitutional:      General: She is not in acute distress.    Appearance: Normal appearance.  HENT:     Head: Normocephalic and atraumatic.  Eyes:     General:        Right eye: No discharge.        Left eye: No discharge.  Cardiovascular:     Rate and Rhythm: Normal rate and regular rhythm.     Heart sounds: No murmur heard.   No friction rub. No gallop.  Pulmonary:     Effort: Pulmonary effort is normal.     Breath sounds: Normal breath sounds.  Abdominal:     General: Bowel sounds are normal.     Palpations: Abdomen is soft.     Comments: Very minimal tenderness  palpation in the left upper quadrant, no rebound, rigidity, guarding, palpable mass or distention noted  Skin:    General: Skin is warm and dry.     Capillary Refill: Capillary refill takes less than 2 seconds.  Neurological:     Mental Status: She is alert and oriented to person, place, and time.  Psychiatric:        Mood and Affect: Mood normal.        Behavior: Behavior normal.    ED Results / Procedures / Treatments   Labs (all labs ordered are listed, but only abnormal results are displayed) Labs Reviewed  COMPREHENSIVE METABOLIC PANEL - Abnormal; Notable for the following components:      Result Value   AST 12 (*)    All other components within normal limits  CBC - Abnormal; Notable for the following components:   RBC 3.74 (*)    Hemoglobin 9.6 (*)    HCT  30.9 (*)    MCH 25.7 (*)    All other components within normal limits  URINALYSIS, ROUTINE W REFLEX MICROSCOPIC - Abnormal; Notable for the following components:   Hgb urine dipstick SMALL (*)    Protein, ur TRACE (*)    Leukocytes,Ua TRACE (*)    All other components within normal limits  MAGNESIUM - Abnormal; Notable for the following components:   Magnesium 1.6 (*)    All other components within normal limits  LIPASE, BLOOD  PREGNANCY, URINE    EKG None  Radiology No results found.  Procedures Procedures    Medications Ordered in ED Medications  alum & mag hydroxide-simeth (MAALOX/MYLANTA) 200-200-20 MG/5ML suspension 30 mL (30 mLs Oral Given 07/13/21 1937)    And  lidocaine (XYLOCAINE) 2 % viscous mouth solution 15 mL (15 mLs Oral Not Given 07/13/21 1937)    ED Course/ Medical Decision Making/ A&P                           Medical Decision Making Amount and/or Complexity of Data Reviewed Labs: ordered.  Risk OTC drugs. Prescription drug management.   This patient is a 46 y.o. female who presents to the ED for concern of abdominal pain, Chronic over the last 2 weeks, this involves an extensive number of treatment options, and is a complaint that carries with it a high risk of complications and morbidity. The emergent differential diagnosis prior to evaluation includes, but is not limited to, nephrolithiasis, pyelonephritis, pain related to CKD, splenic pathology, lower lobar pneumonia, GERD, gastric ulcer, vs other acute or surgical intra-abdominal abnormality.   This is not an exhaustive differential.   Past Medical History / Co-morbidities / Social History: PKD, menorrhagia, fibroids, anemia  Additional history: Chart reviewed. Pertinent results include: Reviewed outpatient PCP, nephrology, urgent care visits.  Notably patient with hemoglobin of 10.8 around a month and half ago which gives a reasonable baseline compared to normal hemoglobin on our files for  around 1 year ago.  Physical Exam: Physical exam performed. The pertinent findings include: Patient with minimal tenderness palpation of the left upper quadrant, no acute distress, nonfebrile, nontoxic-appearing.  Lab Tests: I ordered, and personally interpreted labs.  The pertinent results include: CBC notable for anemia, hemoglobin 9.6.  This is decreased compared to patient's baseline, however she reports heavy menstrual cycle, history of similar.  She denies dark tarry stool.  She reports occasional sporadic bright red blood with straining bowel movements, but no persistent bleeding per rectum.  Notably no clinically significant leukocytosis or platelet abnormality.  CMP is overall unremarkable.  Lipase negative, urinalysis overall unremarkable, trace hemoglobin, proteins, leukocytes, not clinically representative of an acute UTI.  Magnesium is minimally low today at 1.6, encouraged oral repletion.  Pregnancy test negative.   Imaging Studies: I ordered imaging studies including plain film radiograph of the chest in context of shortness of breath, left upper quadrant pain.  Patient declined this study at this time.  We considered CT abdomen pelvis in context of abdominal pain, however with no clinically significant leukocytosis, unremarkable abdominal exam, left upper quadrant pain which seems resolved with GI cocktail have low clinical suspicion for acute intra abdominal abnormality.  Performed shared decision making with the patient and will defer at this time.   Medications: I ordered medication including Maalox, Mylanta, viscous lidocaine for stomach pain. Reevaluation of the patient after these medicines showed that the patient improved. I have reviewed the patients home medicines and have made adjustments as needed.   Disposition: After consideration of the diagnostic results and the patients response to treatment, I feel that in context of patient's persistent chronic left upper quadrant  pain, resolution with GI cocktail, history of acid reflux, as well as some dysphagia in the past I have suspicion the patient may have some esophagitis versus gastritis versus early peptic ulcer disease versus other abnormalities of the stomach.  I have low clinical suspicion for other acute intra-abdominal abnormalities.  Encouraged treatment with PPI, encourage patient to continue her omeprazole, and will have her add on Maalox, Mylanta, Carafate, viscous lidocaine as needed.  Patient already has a GI follow-up next month.  She is discharged in stable condition at this time with extensive return precautions.   I discussed this case with my attending physician Dr. Alvino Chapel who cosigned this note including patient's presenting symptoms, physical exam, and planned diagnostics and interventions. Attending physician stated agreement with plan or made changes to plan which were implemented.    Final Clinical Impression(s) / ED Diagnoses Final diagnoses:  LUQ abdominal pain    Rx / DC Orders ED Discharge Orders          Ordered    lidocaine (XYLOCAINE) 2 % solution  As needed        07/13/21 2032    sucralfate (CARAFATE) 1 g tablet  3 times daily PRN        07/13/21 2032              Dorien Chihuahua 07/13/21 2046    Davonna Belling, MD 07/13/21 2229

## 2022-02-23 IMAGING — CT CT HEAD W/O CM
4 series · 17 of 47 positions shown, 19 images · non-contrast
Comparison: None.

CLINICAL DATA: New headache

EXAM:
CT HEAD WITHOUT CONTRAST
TECHNIQUE: Contiguous axial images were obtained from the base of the skull
through the vertex without intravenous contrast.

[Series 2: head wo · axial · 0.41mm/px · z∈[-16,+99]mm · 7 of 31 slices shown, 9 images]
[im 4/31  brain]
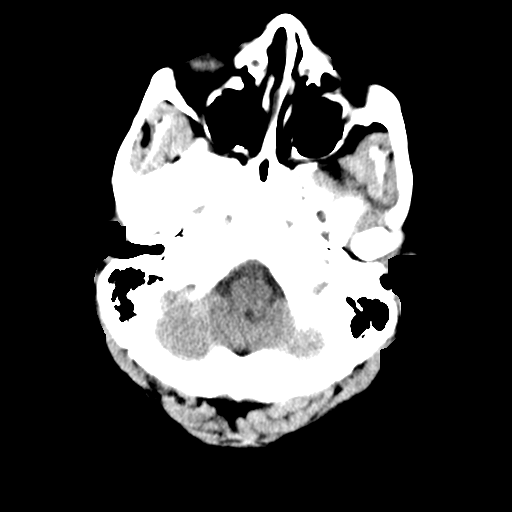
[im 4/31  bone]
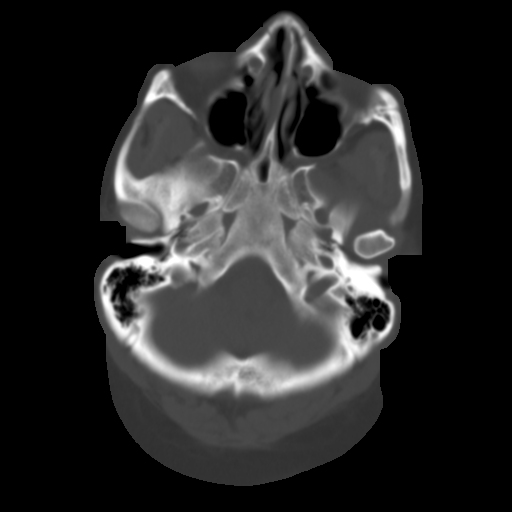
[im 8/31  brain]
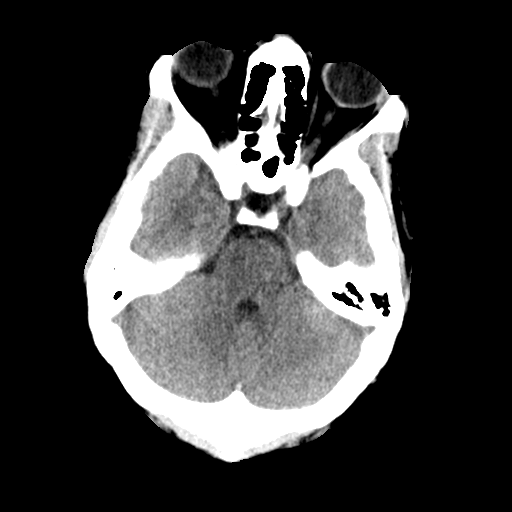
[im 12/31  brain]
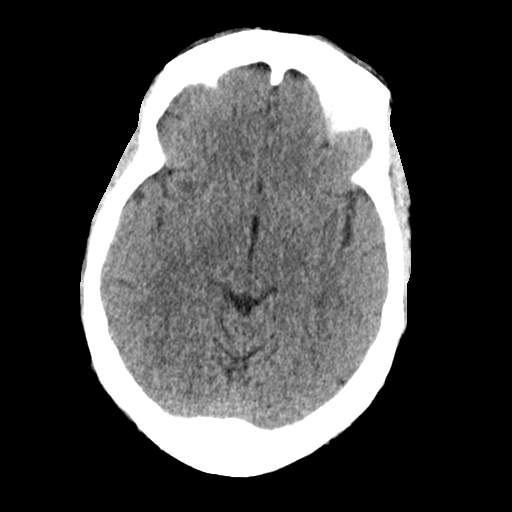
[im 16/31  brain]
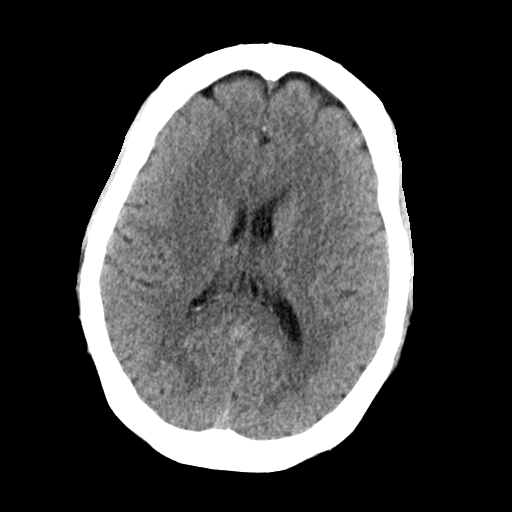
[im 19/31  brain]
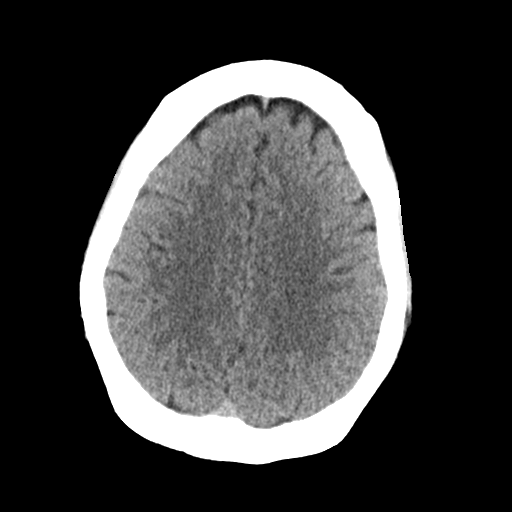
[im 19/31  bone]
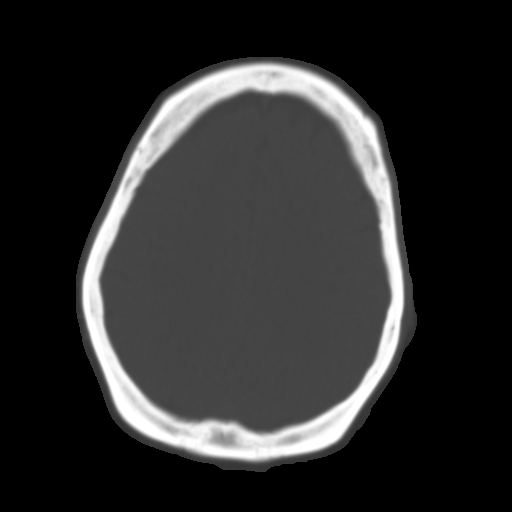
[im 23/31  brain]
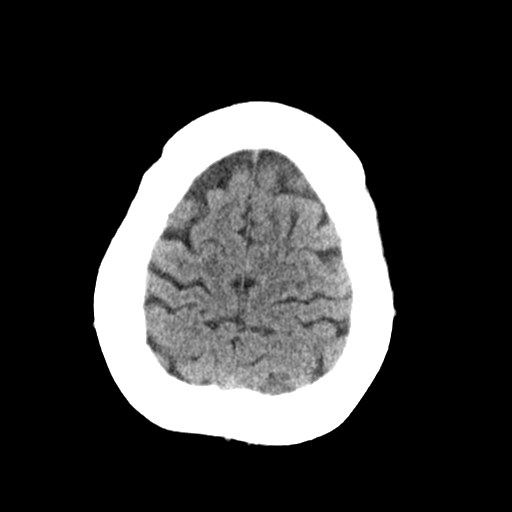
[im 27/31  brain]
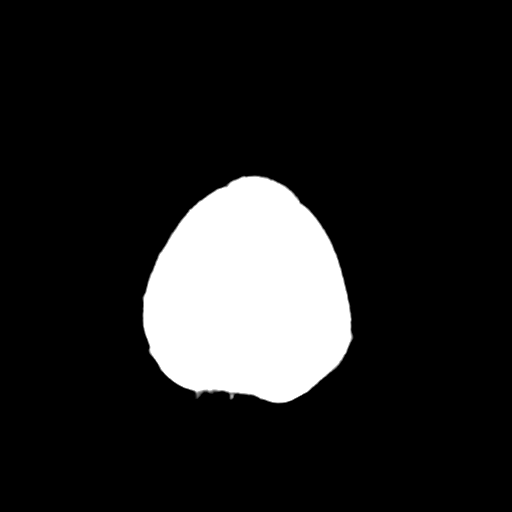

[Series 3: head bone · axial · 0.41mm/px · z∈[-17,+35]mm · 4 of 76 slices shown]
[im 8/76  bone]
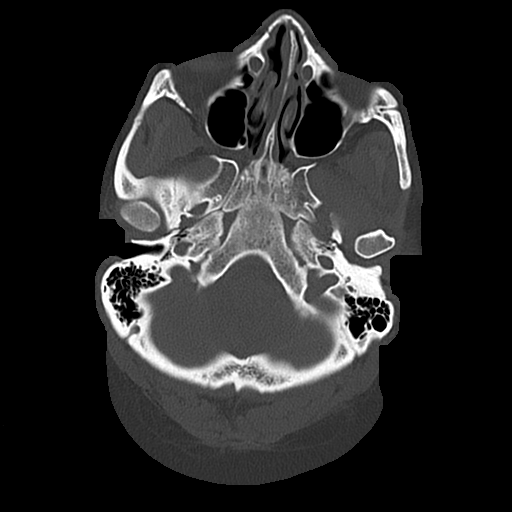
[im 16/76  bone]
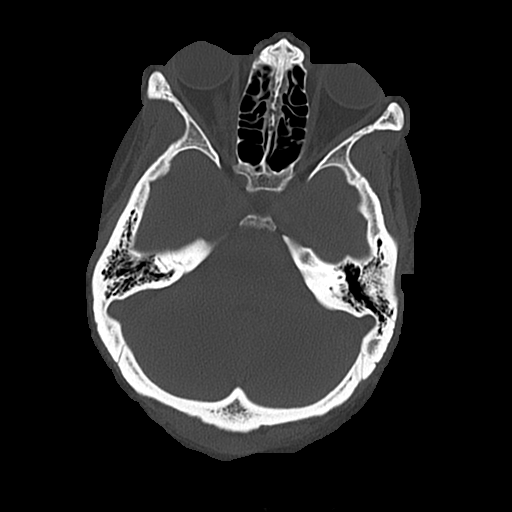
[im 23/76  bone]
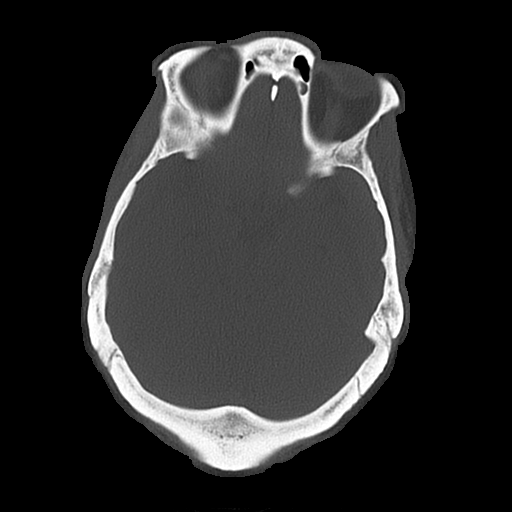
[im 34/76  bone]
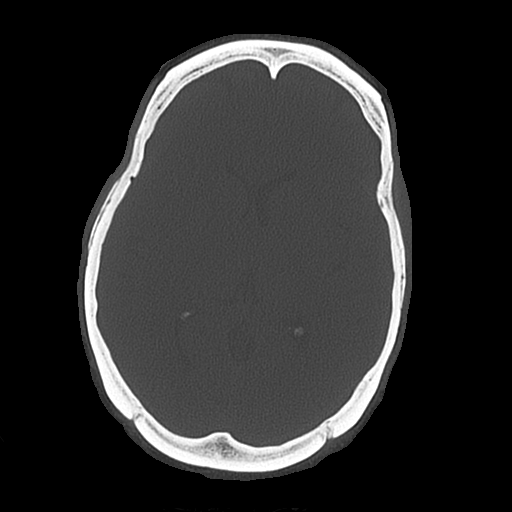

[Series 4: coronal soft · coronal · 0.31mm/px · 3 of 73 slices shown]
[im 25/73  brain]
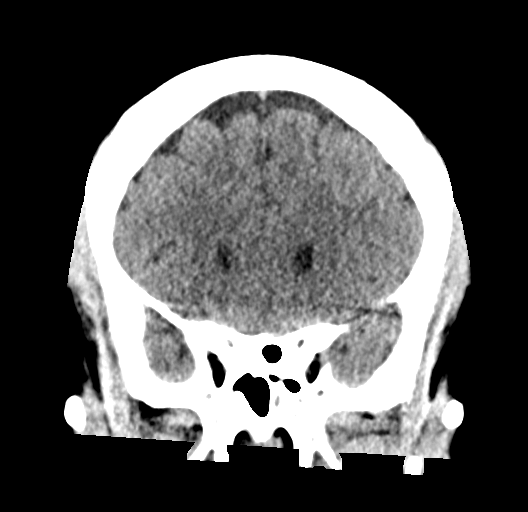
[im 33/73  brain]
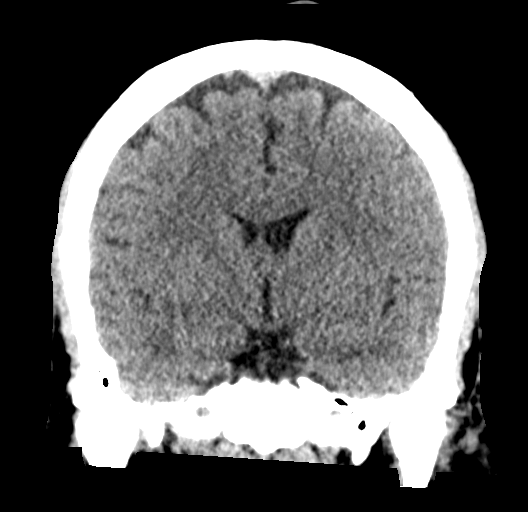
[im 41/73  brain]
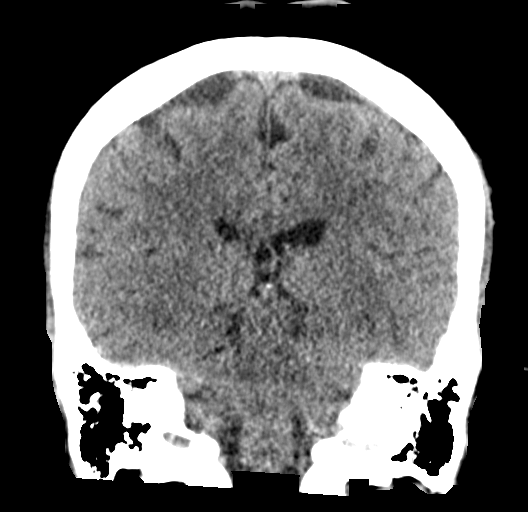

[Series 5: sagittal soft · sagittal · 0.29mm/px · 3 of 56 slices shown]
[im 19/56  brain]
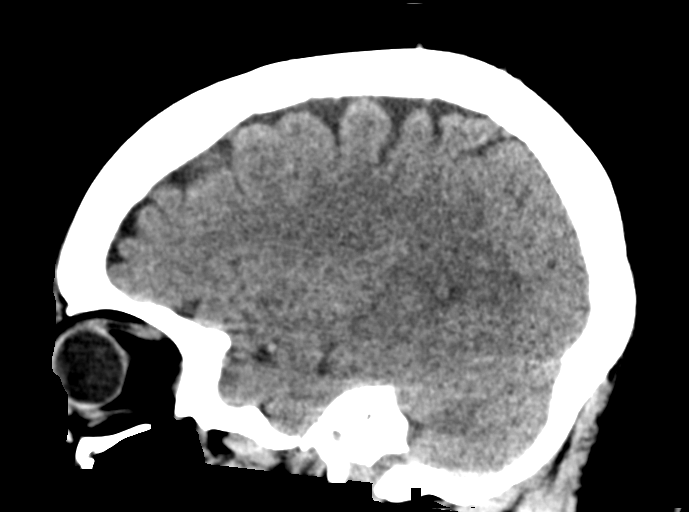
[im 28/56  brain]
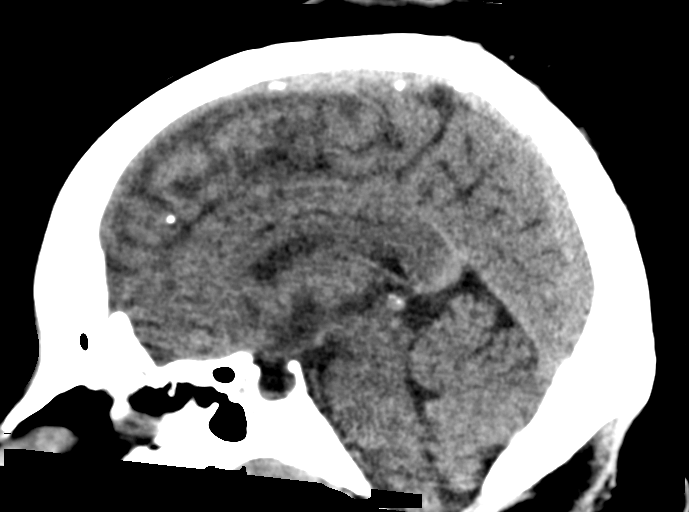
[im 37/56  brain]
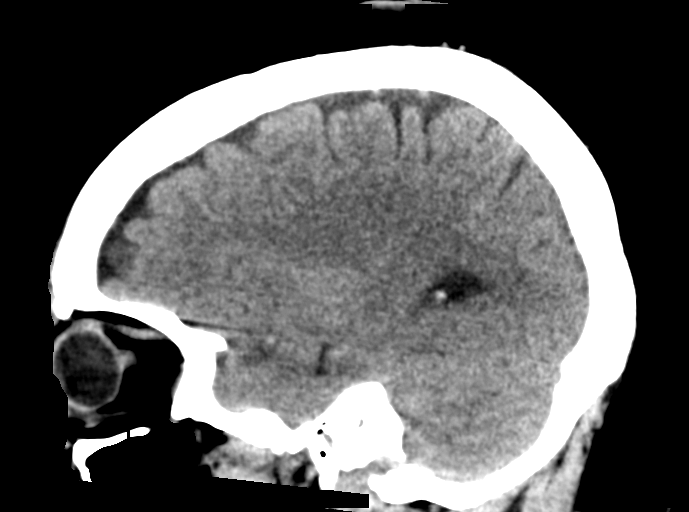

[17 of 47 positions shown; findings below may reference images not displayed]

FINDINGS: Brain: No evidence of acute infarction, hemorrhage, hydrocephalus,
extra-axial collection or mass lesion/mass effect.

Vascular: No hyperdense vessel or unexpected calcification.

Skull: Normal. Negative for fracture or focal lesion.

Sinuses/Orbits: No acute finding.

Other: None.
IMPRESSION: No acute intracranial pathology. No non-contrast CT findings to
explain headache.

## 2022-04-14 ENCOUNTER — Other Ambulatory Visit: Payer: Self-pay

## 2022-04-14 ENCOUNTER — Emergency Department (HOSPITAL_BASED_OUTPATIENT_CLINIC_OR_DEPARTMENT_OTHER)
Admission: EM | Admit: 2022-04-14 | Discharge: 2022-04-14 | Disposition: A | Discharge status: Home / Self Care | Payer: PRIVATE HEALTH INSURANCE | Attending: Emergency Medicine | Admitting: Emergency Medicine

## 2022-04-14 ENCOUNTER — Emergency Department (HOSPITAL_BASED_OUTPATIENT_CLINIC_OR_DEPARTMENT_OTHER): Payer: PRIVATE HEALTH INSURANCE

## 2022-04-14 DIAGNOSIS — R1012 Left upper quadrant pain: Secondary | ICD-10-CM | POA: Diagnosis present

## 2022-04-14 DIAGNOSIS — D649 Anemia, unspecified: Secondary | ICD-10-CM | POA: Diagnosis not present

## 2022-04-14 DIAGNOSIS — K922 Gastrointestinal hemorrhage, unspecified: Secondary | ICD-10-CM | POA: Insufficient documentation

## 2022-04-14 LAB — CBC
HCT: 32.7 % — ABNORMAL LOW (ref 36.0–46.0)
Hemoglobin: 10.5 g/dL — ABNORMAL LOW (ref 12.0–15.0)
MCH: 26.6 pg (ref 26.0–34.0)
MCHC: 32.1 g/dL (ref 30.0–36.0)
MCV: 82.8 fL (ref 80.0–100.0)
Platelets: 226 10*3/uL (ref 150–400)
RBC: 3.95 MIL/uL (ref 3.87–5.11)
RDW: 14.9 % (ref 11.5–15.5)
WBC: 8.1 10*3/uL (ref 4.0–10.5)
nRBC: 0 % (ref 0.0–0.2)

## 2022-04-14 LAB — URINALYSIS, ROUTINE W REFLEX MICROSCOPIC
Bilirubin Urine: NEGATIVE
Glucose, UA: NEGATIVE mg/dL
Ketones, ur: NEGATIVE mg/dL
Nitrite: NEGATIVE
Specific Gravity, Urine: 1.021 (ref 1.005–1.030)
pH: 7.5 (ref 5.0–8.0)

## 2022-04-14 LAB — COMPREHENSIVE METABOLIC PANEL
ALT: 11 U/L (ref 0–44)
AST: 11 U/L — ABNORMAL LOW (ref 15–41)
Albumin: 3.9 g/dL (ref 3.5–5.0)
Alkaline Phosphatase: 40 U/L (ref 38–126)
Anion gap: 6 (ref 5–15)
BUN: 16 mg/dL (ref 6–20)
CO2: 26 mmol/L (ref 22–32)
Calcium: 9.5 mg/dL (ref 8.9–10.3)
Chloride: 106 mmol/L (ref 98–111)
Creatinine, Ser: 1.01 mg/dL — ABNORMAL HIGH (ref 0.44–1.00)
GFR, Estimated: >60 mL/min (ref >60)
Glucose, Bld: 84 mg/dL (ref 70–99)
Potassium: 3.8 mmol/L (ref 3.5–5.1)
Sodium: 138 mmol/L (ref 135–145)
Total Bilirubin: 0.3 mg/dL (ref 0.3–1.2)
Total Protein: 7.1 g/dL (ref 6.5–8.1)

## 2022-04-14 LAB — PREGNANCY, URINE: Preg Test, Ur: NEGATIVE

## 2022-04-14 LAB — LIPASE, BLOOD: Lipase: 28 U/L (ref 11–51)

## 2022-04-14 NOTE — ED Triage Notes (Signed)
Pt in with stabbing pain underneath L breast, ongoing since midnight. Pt states she has hx of scleroderma and began to have L neck pain 2 wks ago, moved into L shoulder. Pt states she went to her doc, and he recommended taking Bayer, Prednisone and Ibuprofen for the past 2 wks but pain is now moved into LUQ region. Does report seeing small amount of BRB on tissue paper last night

## 2022-04-14 NOTE — ED Provider Notes (Signed)
[facility] Provider Note   CSN: [id number] Arrival date & time: 04/14/22  2011     History  Chief Complaint  Patient presents with   L side pain    Katherine Hutchinson is a 47 y.o. female.  HPI Patient presents with left-sided chest pain.  Has had for weeks now.  Has had pain in her left neck that then went down to the left shoulder and chest.  Now in left upper quadrant.  Has been on aspirin and prednisone and ibuprofen.  States has scleroderma.  No lightheadedness dizziness.  States pain could be scleroderma flare but usually improved by prednisone.   Past Medical History:  Diagnosis Date   Anemia    Chlamydia    Fibroids    uterine   FIBROIDS, UTERUS 06/04/2008   Qualifier: Diagnosis of  By: [name], [name]     Genital warts 03/06/2011   H/O right breast biopsy    benign   History of blood transfusion    after d&c   Morbid obesity ([place])    Obesity    Uterus, adenomyosis 01/17/2011   Suggested by transvaginal US in 09/2010     Home Medications Prior to Admission medications   Medication Sig Start Date End Date Taking? Authorizing Provider  amLODipine (NORVASC) 5 MG tablet Take 1 tablet (5 mg total) by mouth daily. 02/21/19   Charolette Forward, MD  ferrous sulfate 325 (65 FE) MG EC tablet Take 325 mg by mouth daily with breakfast.    [provider]  lidocaine (XYLOCAINE) 2 % solution Use as directed 15 mLs in the mouth or throat as needed for mouth pain. 07/13/21   Prosperi, Christian H, PA-C  omeprazole (PRILOSEC) 40 MG capsule Take 40 mg by mouth daily. 12/07/18   [provider]  potassium chloride SA (KLOR-CON) 20 MEQ tablet Take 1 tablet (20 mEq total) by mouth daily. 11/09/20   Harris, Vernie Shanks, PA-C  sucralfate (CARAFATE) 1 g tablet Take 1 tablet (1 g total) by mouth 3 (three) times daily as needed. 07/13/21   Prosperi, Christian H, PA-C  Vitamin D, Ergocalciferol, (DRISDOL) 1.25 MG (50000 UT) CAPS capsule Take  50,000 Units by mouth once a week. 01/29/19   [provider]      Allergies    Shellfish-derived products and Diflucan [fluconazole]    Review of Systems   Review of Systems  Physical Exam Updated Vital Signs BP 123/85   Pulse 65   Temp 98.2 F (36.8 C) (Oral)   Resp 15   Wt 108 kg   SpO2 99%   BMI 39.61 kg/m  Physical Exam Vitals and nursing note reviewed.  HENT:     Head: Atraumatic.  Eyes:     Pupils: Pupils are equal, round, and reactive to light.  Cardiovascular:     Rate and Rhythm: Regular rhythm.  Abdominal:     Tenderness: There is abdominal tenderness.     Comments: Mild left upper quadrant tenderness without rebound or guarding.  No hernia palpated.  Musculoskeletal:        General: No tenderness.     Cervical back: Neck supple.  Skin:    General: Skin is warm.  Neurological:     Mental Status: She is alert and oriented to person, place, and time.     ED Results / Procedures / Treatments   Labs (all labs ordered are listed, but only abnormal results are displayed) Labs Reviewed  COMPREHENSIVE  METABOLIC PANEL - Abnormal; Notable for the following components:      Result Value   Creatinine, Ser 1.01 (*)    AST 11 (*)    All other components within normal limits  CBC - Abnormal; Notable for the following components:   Hemoglobin 10.5 (*)    HCT 32.7 (*)    All other components within normal limits  URINALYSIS, ROUTINE W REFLEX MICROSCOPIC - Abnormal; Notable for the following components:   Hgb urine dipstick MODERATE (*)    Protein, ur TRACE (*)    Leukocytes,Ua SMALL (*)    Bacteria, UA FEW (*)    All other components within normal limits  LIPASE, BLOOD  PREGNANCY, URINE    EKG EKG Interpretation  Date/Time:  Saturday April 14 2022 20:39:10 EST Ventricular Rate:  67 PR Interval:  164 QRS Duration: 86 QT Interval:  362 QTC Calculation: 383 R Axis:   59 Text Interpretation: Sinus arrhythmia Confirmed by Davonna Belling  [phone]) on 04/14/2022 10:23:00 PM  Radiology DG Chest Portable 1 View  Result Date: 04/14/2022 CLINICAL DATA:  Chest pain EXAM: PORTABLE CHEST 1 VIEW COMPARISON:  01/05/2022 FINDINGS: The heart size and mediastinal contours are within normal limits. Both lungs are clear. The visualized skeletal structures are unremarkable. IMPRESSION: No active disease. Electronically Signed   By: Fidela Salisbury M.D.   On: 04/14/2022 21:27    Procedures Procedures    Medications Ordered in ED Medications - No data to display  ED Course/ Medical Decision Making/ A&P                             Medical Decision Making Amount and/or Complexity of Data Reviewed Labs: ordered. Radiology: ordered.   Patient with left neck pain that then went to left chest and now abdomen.  Has had over the last 2 weeks.  States she did have small amount of blood in the stool.  No nausea or vomiting.  EKG reassuring.  No urinary rooms.  Differential diagnosis includes various causes of intra-abdominal pain.  Including gastritis, patient is worried about splenic rupture.  I actually doubt splenic rupture particularly since she says her abdomen is more swollen.  I would assume severe hypertension if she was bleeding enough to have a swollen abdomen.  Chest x-ray reassuring.  Hemoglobin is reassuring.  Similar to prior.  Normal lipase.  Reviewed blood work.  Saw previous CT scans and a been reassuring.  Mild anemia that is decreased from a few days ago but appears to be near baseline.  Appears stable for discharge home.        Final Clinical Impression(s) / ED Diagnoses Final diagnoses:  Left upper quadrant abdominal pain  Gastrointestinal hemorrhage, unspecified gastrointestinal hemorrhage type    Rx / DC Orders ED Discharge Orders     None         Davonna Belling, MD 04/14/22 2335

## 2022-04-14 NOTE — Discharge Instructions (Addendum)
Your lab work is reassuring today.  Your hemoglobin is higher than has been here but slightly lower than it had been at your PCP.  Follow-up with gastroenterology for the bleeding.  Return for worsening lightheadedness or dizziness.

## 2022-04-14 NOTE — ED Notes (Signed)
Patient verbalizes understanding of discharge instructions. Opportunity for questioning and answers were provided. Armband removed by staff, pt discharged from ED. Ambulated out to lobby
